# Patient Record
Sex: Female | Born: 1990 | Hispanic: No | Marital: Married | Smoking: Never smoker
Health system: Southern US, Community
[De-identification: ages and names within clinical notes are randomized; demographics above are authoritative.]

## PROBLEM LIST (undated history)

## (undated) DIAGNOSIS — O24419 Gestational diabetes mellitus in pregnancy, unspecified control: Secondary | ICD-10-CM

## (undated) DIAGNOSIS — K219 Gastro-esophageal reflux disease without esophagitis: Secondary | ICD-10-CM

## (undated) DIAGNOSIS — N979 Female infertility, unspecified: Secondary | ICD-10-CM

## (undated) DIAGNOSIS — E282 Polycystic ovarian syndrome: Secondary | ICD-10-CM

## (undated) HISTORY — PX: NO PAST SURGERIES: SHX2092

## (undated) HISTORY — DX: Polycystic ovarian syndrome: E28.2

---

## 2017-08-27 DIAGNOSIS — Z3149 Encounter for other procreative investigation and testing: Secondary | ICD-10-CM | POA: Diagnosis not present

## 2017-09-06 DIAGNOSIS — Z3149 Encounter for other procreative investigation and testing: Secondary | ICD-10-CM | POA: Diagnosis not present

## 2017-09-08 DIAGNOSIS — J069 Acute upper respiratory infection, unspecified: Secondary | ICD-10-CM | POA: Diagnosis not present

## 2017-10-07 DIAGNOSIS — Z3149 Encounter for other procreative investigation and testing: Secondary | ICD-10-CM | POA: Diagnosis not present

## 2017-10-15 DIAGNOSIS — Z0000 Encounter for general adult medical examination without abnormal findings: Secondary | ICD-10-CM | POA: Diagnosis not present

## 2017-10-25 DIAGNOSIS — O0900 Supervision of pregnancy with history of infertility, unspecified trimester: Secondary | ICD-10-CM | POA: Diagnosis not present

## 2017-10-25 DIAGNOSIS — Z3201 Encounter for pregnancy test, result positive: Secondary | ICD-10-CM | POA: Diagnosis not present

## 2017-10-25 DIAGNOSIS — Z3A01 Less than 8 weeks gestation of pregnancy: Secondary | ICD-10-CM | POA: Diagnosis not present

## 2017-11-21 DIAGNOSIS — Z3201 Encounter for pregnancy test, result positive: Secondary | ICD-10-CM | POA: Diagnosis not present

## 2017-12-10 DIAGNOSIS — Z118 Encounter for screening for other infectious and parasitic diseases: Secondary | ICD-10-CM | POA: Diagnosis not present

## 2017-12-10 DIAGNOSIS — O0900 Supervision of pregnancy with history of infertility, unspecified trimester: Secondary | ICD-10-CM | POA: Diagnosis not present

## 2017-12-10 LAB — OB RESULTS CONSOLE RUBELLA ANTIBODY, IGM: Rubella: IMMUNE

## 2017-12-10 LAB — OB RESULTS CONSOLE HEPATITIS B SURFACE ANTIGEN: Hepatitis B Surface Ag: NEGATIVE

## 2017-12-10 LAB — OB RESULTS CONSOLE ABO/RH: RH Type: POSITIVE

## 2017-12-10 LAB — OB RESULTS CONSOLE HIV ANTIBODY (ROUTINE TESTING): HIV: NONREACTIVE

## 2017-12-10 LAB — OB RESULTS CONSOLE GC/CHLAMYDIA
Chlamydia: NEGATIVE
Gonorrhea: NEGATIVE

## 2017-12-10 LAB — OB RESULTS CONSOLE RPR: RPR: NONREACTIVE

## 2018-01-09 DIAGNOSIS — Z3A16 16 weeks gestation of pregnancy: Secondary | ICD-10-CM | POA: Diagnosis not present

## 2018-01-09 DIAGNOSIS — O0900 Supervision of pregnancy with history of infertility, unspecified trimester: Secondary | ICD-10-CM | POA: Diagnosis not present

## 2018-01-27 ENCOUNTER — Encounter (HOSPITAL_COMMUNITY): Payer: Self-pay | Admitting: Obstetrics

## 2018-01-27 DIAGNOSIS — O0900 Supervision of pregnancy with history of infertility, unspecified trimester: Secondary | ICD-10-CM | POA: Diagnosis not present

## 2018-01-27 DIAGNOSIS — Z3A18 18 weeks gestation of pregnancy: Secondary | ICD-10-CM | POA: Diagnosis not present

## 2018-01-28 ENCOUNTER — Encounter (HOSPITAL_COMMUNITY): Payer: Self-pay

## 2018-01-29 NOTE — L&D Delivery Note (Signed)
Delivery Note At 2:02 AM a viable female was delivered via Vaginal, Spontaneous (Presentation: frank breech, right sacrum transverse;  ).  APGAR: 7, 9; weight  .  pending Placenta status: , spontaneous, intact.  Cord:  3VC with the following  complications: .none  Cord pH: n/a  Once pt began to feel pressure and sacrum at introitus pt began to push in lithotomy position. Foley catheter removed. On third set of pushes hips delivered and legs followed spontaneously. Baby then delivered to shoulders spontaneously. Body supported. Cord pulled down. Left arm delivered spontaneously and baby rotated for easy delivery of right arm. Towel wrapped around baby and and with gentle elevation, about 30 degrees, suprapubic pressure finger in mouth for flexion, head delivered easily.   Anesthesia:  epidural Episiotomy: None Lacerations: 2nd degree;Perineal Suture Repair: 3.0 vicryl rapide Est. Blood Loss (mL): 176  Mom to postpartum.  Baby to Couplet care / Skin to Skin   Plan to call NICU for eval if baby still alive at 2 hrs of age to discuss comfort care.  Lendon Colonel 05/22/2018, 2:21 AM

## 2018-02-03 ENCOUNTER — Other Ambulatory Visit (HOSPITAL_COMMUNITY): Payer: Self-pay | Admitting: *Deleted

## 2018-02-03 ENCOUNTER — Encounter (HOSPITAL_COMMUNITY): Payer: Self-pay | Admitting: *Deleted

## 2018-02-03 DIAGNOSIS — Q614 Renal dysplasia: Secondary | ICD-10-CM

## 2018-02-05 ENCOUNTER — Ambulatory Visit (HOSPITAL_COMMUNITY)
Admission: RE | Admit: 2018-02-05 | Discharge: 2018-02-05 | Disposition: A | Discharge status: Home / Self Care | Payer: 59 | Source: Ambulatory Visit | Attending: Obstetrics | Admitting: Obstetrics

## 2018-02-05 ENCOUNTER — Other Ambulatory Visit (HOSPITAL_COMMUNITY): Payer: Self-pay | Admitting: *Deleted

## 2018-02-05 ENCOUNTER — Encounter (HOSPITAL_COMMUNITY): Payer: Self-pay

## 2018-02-05 DIAGNOSIS — Z363 Encounter for antenatal screening for malformations: Secondary | ICD-10-CM | POA: Diagnosis not present

## 2018-02-05 DIAGNOSIS — O358XX0 Maternal care for other (suspected) fetal abnormality and damage, not applicable or unspecified: Secondary | ICD-10-CM | POA: Diagnosis not present

## 2018-02-05 DIAGNOSIS — Z3A20 20 weeks gestation of pregnancy: Secondary | ICD-10-CM | POA: Diagnosis not present

## 2018-02-05 DIAGNOSIS — Q614 Renal dysplasia: Secondary | ICD-10-CM

## 2018-02-05 HISTORY — DX: Female infertility, unspecified: N97.9

## 2018-02-05 HISTORY — DX: Gastro-esophageal reflux disease without esophagitis: K21.9

## 2018-03-12 ENCOUNTER — Other Ambulatory Visit (HOSPITAL_COMMUNITY): Payer: Self-pay | Admitting: *Deleted

## 2018-03-12 ENCOUNTER — Ambulatory Visit (HOSPITAL_COMMUNITY)
Admission: RE | Admit: 2018-03-12 | Discharge: 2018-03-12 | Disposition: A | Discharge status: Home / Self Care | Payer: 59 | Source: Ambulatory Visit | Attending: Obstetrics | Admitting: Obstetrics

## 2018-03-12 ENCOUNTER — Encounter (HOSPITAL_COMMUNITY): Payer: Self-pay

## 2018-03-12 DIAGNOSIS — Z3A25 25 weeks gestation of pregnancy: Secondary | ICD-10-CM | POA: Diagnosis not present

## 2018-03-12 DIAGNOSIS — O358XX0 Maternal care for other (suspected) fetal abnormality and damage, not applicable or unspecified: Secondary | ICD-10-CM

## 2018-03-12 DIAGNOSIS — Z362 Encounter for other antenatal screening follow-up: Secondary | ICD-10-CM

## 2018-03-12 DIAGNOSIS — Q614 Renal dysplasia: Secondary | ICD-10-CM | POA: Diagnosis present

## 2018-03-12 DIAGNOSIS — O4102X0 Oligohydramnios, second trimester, not applicable or unspecified: Secondary | ICD-10-CM | POA: Diagnosis not present

## 2018-03-19 ENCOUNTER — Encounter (HOSPITAL_COMMUNITY): Payer: Self-pay

## 2018-03-19 ENCOUNTER — Ambulatory Visit (HOSPITAL_COMMUNITY)
Admission: RE | Admit: 2018-03-19 | Discharge: 2018-03-19 | Disposition: A | Discharge status: Home / Self Care | Payer: 59 | Source: Ambulatory Visit | Attending: Obstetrics | Admitting: Obstetrics

## 2018-03-19 DIAGNOSIS — O358XX0 Maternal care for other (suspected) fetal abnormality and damage, not applicable or unspecified: Secondary | ICD-10-CM | POA: Diagnosis not present

## 2018-03-19 DIAGNOSIS — Z3A26 26 weeks gestation of pregnancy: Secondary | ICD-10-CM

## 2018-03-19 DIAGNOSIS — Q614 Renal dysplasia: Secondary | ICD-10-CM | POA: Insufficient documentation

## 2018-03-19 NOTE — ED Notes (Signed)
Natasha Mead with palliative notified of need for consult.

## 2018-03-19 NOTE — ED Notes (Signed)
Contacted Hoy Finlay, RN to arrange NICU consult.  She will try to arrange consult for next week.

## 2018-03-25 ENCOUNTER — Encounter (HOSPITAL_COMMUNITY): Payer: Self-pay

## 2018-03-25 NOTE — Progress Notes (Signed)
Neonatology consult scheduled for 03/28/2018 at 1:00 at the [facility]. Family to call to the unit and Hoy Finlay, Discharge Coordinator, will come to the security desk and escort the family to the 3rd floor, NICU, for neonatology consult.

## 2018-03-26 ENCOUNTER — Encounter: Payer: Self-pay | Admitting: Registered"

## 2018-03-26 ENCOUNTER — Encounter: Payer: 59 | Attending: Obstetrics | Admitting: Registered"

## 2018-03-26 DIAGNOSIS — O99810 Abnormal glucose complicating pregnancy: Secondary | ICD-10-CM | POA: Diagnosis present

## 2018-03-26 NOTE — Progress Notes (Signed)
Patient was seen on 03/26/18 for Gestational Diabetes self-management class at [facility]. The following learning objectives were met by the patient during this course:   States the definition of Gestational Diabetes  States why dietary management is important in controlling blood glucose  Describes the effects each nutrient has on blood glucose levels  Demonstrates ability to create a balanced meal plan  Demonstrates carbohydrate counting   States when to check blood glucose levels  Demonstrates proper blood glucose monitoring techniques  States the effect of stress and exercise on blood glucose levels  States the importance of limiting caffeine and abstaining from alcohol and smoking  Blood glucose monitor given: [employer] Lot # [id number] X Exp: 09/29/18 Blood glucose reading: 70 mg/dL  Patient instructed to monitor glucose levels: FBS: 60 - <95; 1 hour: <140; 2 hour: <120  Patient received handouts:  Nutrition Diabetes and Pregnancy, including carb counting list  Patient will be seen for follow-up as needed.

## 2018-03-28 ENCOUNTER — Encounter: Payer: Self-pay | Admitting: Pediatrics

## 2018-03-28 ENCOUNTER — Telehealth: Payer: Self-pay | Admitting: Pediatrics

## 2018-04-02 ENCOUNTER — Encounter (HOSPITAL_COMMUNITY): Payer: Self-pay | Admitting: *Deleted

## 2018-04-02 ENCOUNTER — Ambulatory Visit (HOSPITAL_COMMUNITY): Payer: 59 | Admitting: *Deleted

## 2018-04-02 ENCOUNTER — Ambulatory Visit (HOSPITAL_COMMUNITY)
Admission: RE | Admit: 2018-04-02 | Discharge: 2018-04-02 | Disposition: A | Discharge status: Home / Self Care | Payer: 59 | Source: Ambulatory Visit | Attending: Obstetrics | Admitting: Obstetrics

## 2018-04-02 VITALS — BP 97/61 | HR 84 | Wt 114.0 lb

## 2018-04-02 DIAGNOSIS — O0990 Supervision of high risk pregnancy, unspecified, unspecified trimester: Secondary | ICD-10-CM

## 2018-04-02 DIAGNOSIS — Z3A28 28 weeks gestation of pregnancy: Secondary | ICD-10-CM

## 2018-04-02 DIAGNOSIS — Q614 Renal dysplasia: Secondary | ICD-10-CM | POA: Diagnosis not present

## 2018-04-02 DIAGNOSIS — O358XX0 Maternal care for other (suspected) fetal abnormality and damage, not applicable or unspecified: Secondary | ICD-10-CM | POA: Diagnosis not present

## 2018-04-03 ENCOUNTER — Other Ambulatory Visit (HOSPITAL_COMMUNITY): Payer: Self-pay | Admitting: *Deleted

## 2018-04-03 DIAGNOSIS — O4103X0 Oligohydramnios, third trimester, not applicable or unspecified: Secondary | ICD-10-CM

## 2018-04-07 NOTE — Social Work (Signed)
LCSW met with Cheryl Barron and Cheryl Barron at [facility] appt.  LCSW joined Dr. Donalee Citrin in the counseling meeting and observed the ultrasound while he counseled them.  Afterwards, SW spoke with mom and dad alone and offered Perinatal Counseling support, which they accepted.  They are planning to come to the [facility] on Monday at 9 am to meet with Cheryl Barron, SW and Cheryl Robinsons, RN.    ** Update**  Met with mom and dad on March 9th at 56 am.  RN assessed their understanding of the diagnosis/prognosis, discussed choices they have at labor/delivery and for baby depending on whether he survives labor or not.  SW discussed the [employer], as well as provided emotional support to mom and dad.  Today was focused on education around the birth plan decisions.  They request to call SW to schedule the next appt and take a copy of the birth plan to look over decisions.  They are very concerned about baby's comfort.  They are willing to consider more aggressive measures if baby's prognosis is better than anticipated.  SW will plan to meet with them again to complete the birth plan.

## 2018-05-01 ENCOUNTER — Ambulatory Visit (HOSPITAL_COMMUNITY): Payer: 59

## 2018-05-05 ENCOUNTER — Encounter (HOSPITAL_COMMUNITY): Payer: Self-pay

## 2018-05-05 ENCOUNTER — Ambulatory Visit (HOSPITAL_COMMUNITY)
Admission: RE | Admit: 2018-05-05 | Discharge: 2018-05-05 | Disposition: A | Discharge status: Home / Self Care | Payer: 59 | Source: Ambulatory Visit | Attending: Obstetrics and Gynecology | Admitting: Obstetrics and Gynecology

## 2018-05-05 ENCOUNTER — Ambulatory Visit (HOSPITAL_COMMUNITY): Payer: 59 | Admitting: *Deleted

## 2018-05-05 ENCOUNTER — Other Ambulatory Visit (HOSPITAL_COMMUNITY): Payer: Self-pay | Admitting: Obstetrics and Gynecology

## 2018-05-05 ENCOUNTER — Other Ambulatory Visit: Payer: Self-pay

## 2018-05-05 VITALS — Temp 98.4°F

## 2018-05-05 DIAGNOSIS — O4103X0 Oligohydramnios, third trimester, not applicable or unspecified: Secondary | ICD-10-CM

## 2018-05-05 DIAGNOSIS — O358XX0 Maternal care for other (suspected) fetal abnormality and damage, not applicable or unspecified: Secondary | ICD-10-CM | POA: Diagnosis not present

## 2018-05-05 DIAGNOSIS — O359XX0 Maternal care for (suspected) fetal abnormality and damage, unspecified, not applicable or unspecified: Secondary | ICD-10-CM

## 2018-05-05 DIAGNOSIS — O365930 Maternal care for other known or suspected poor fetal growth, third trimester, not applicable or unspecified: Secondary | ICD-10-CM | POA: Diagnosis not present

## 2018-05-05 DIAGNOSIS — Z3A32 32 weeks gestation of pregnancy: Secondary | ICD-10-CM

## 2018-05-21 ENCOUNTER — Inpatient Hospital Stay (HOSPITAL_COMMUNITY): Payer: 59 | Admitting: Anesthesiology

## 2018-05-21 ENCOUNTER — Encounter (HOSPITAL_COMMUNITY): Payer: Self-pay | Admitting: *Deleted

## 2018-05-21 ENCOUNTER — Other Ambulatory Visit: Payer: Self-pay

## 2018-05-21 ENCOUNTER — Inpatient Hospital Stay (HOSPITAL_COMMUNITY)
Admission: AD | Admit: 2018-05-21 | Discharge: 2018-05-23 | DRG: 807 | Disposition: A | Discharge status: Home / Self Care | Payer: 59 | Attending: Obstetrics | Admitting: Obstetrics

## 2018-05-21 DIAGNOSIS — O35EXX0 Maternal care for other (suspected) fetal abnormality and damage, fetal genitourinary anomalies, not applicable or unspecified: Secondary | ICD-10-CM

## 2018-05-21 DIAGNOSIS — O358XX0 Maternal care for other (suspected) fetal abnormality and damage, not applicable or unspecified: Secondary | ICD-10-CM | POA: Diagnosis present

## 2018-05-21 DIAGNOSIS — O24420 Gestational diabetes mellitus in childbirth, diet controlled: Secondary | ICD-10-CM | POA: Diagnosis present

## 2018-05-21 DIAGNOSIS — O359XX0 Maternal care for (suspected) fetal abnormality and damage, unspecified, not applicable or unspecified: Secondary | ICD-10-CM | POA: Diagnosis present

## 2018-05-21 DIAGNOSIS — O4103X0 Oligohydramnios, third trimester, not applicable or unspecified: Secondary | ICD-10-CM | POA: Diagnosis present

## 2018-05-21 DIAGNOSIS — O321XX0 Maternal care for breech presentation, not applicable or unspecified: Secondary | ICD-10-CM | POA: Diagnosis present

## 2018-05-21 DIAGNOSIS — Z3A35 35 weeks gestation of pregnancy: Secondary | ICD-10-CM | POA: Diagnosis not present

## 2018-05-21 HISTORY — DX: Gestational diabetes mellitus in pregnancy, unspecified control: O24.419

## 2018-05-21 LAB — CBC
HCT: 41.4 % (ref 36.0–46.0)
Hemoglobin: 13.5 g/dL (ref 12.0–15.0)
MCH: 29.9 pg (ref 26.0–34.0)
MCHC: 32.6 g/dL (ref 30.0–36.0)
MCV: 91.6 fL (ref 80.0–100.0)
Platelets: 252 10*3/uL (ref 150–400)
RBC: 4.52 MIL/uL (ref 3.87–5.11)
RDW: 13.6 % (ref 11.5–15.5)
WBC: 11.5 10*3/uL — ABNORMAL HIGH (ref 4.0–10.5)
nRBC: 0 % (ref 0.0–0.2)

## 2018-05-21 LAB — TYPE AND SCREEN
ABO/RH(D): A POS
Antibody Screen: NEGATIVE

## 2018-05-21 LAB — GROUP B STREP BY PCR: Group B strep by PCR: NEGATIVE

## 2018-05-21 MED ORDER — LACTATED RINGERS IV SOLN
500.0000 mL | Freq: Once | INTRAVENOUS | Status: AC
  Administered 2018-05-21: 500 mL via INTRAVENOUS

## 2018-05-21 MED ORDER — PHENYLEPHRINE 40 MCG/ML (10ML) SYRINGE FOR IV PUSH (FOR BLOOD PRESSURE SUPPORT): 80.0000 ug | PREFILLED_SYRINGE | INTRAVENOUS | Status: DC | PRN

## 2018-05-21 MED ORDER — EPHEDRINE 5 MG/ML INJ: 10.0000 mg | INTRAVENOUS | Status: DC | PRN

## 2018-05-21 MED ORDER — TERBUTALINE SULFATE 1 MG/ML IJ SOLN: 0.2500 mg | Freq: Once | INTRAMUSCULAR | Status: DC | PRN

## 2018-05-21 MED ORDER — LIDOCAINE HCL (PF) 1 % IJ SOLN: 30.0000 mL | INTRAMUSCULAR | Status: DC | PRN

## 2018-05-21 MED ORDER — LACTATED RINGERS IV SOLN
INTRAVENOUS | Status: DC
  Administered 2018-05-21 (×2): via INTRAVENOUS

## 2018-05-21 MED ORDER — OXYTOCIN 40 UNITS IN NORMAL SALINE INFUSION - SIMPLE MED
2.5000 [IU]/h | INTRAVENOUS | Status: DC
  Filled 2018-05-21: qty 1000

## 2018-05-21 MED ORDER — OXYTOCIN BOLUS FROM INFUSION
500.0000 mL | Freq: Once | INTRAVENOUS | Status: AC
  Administered 2018-05-22: 500 mL via INTRAVENOUS

## 2018-05-21 MED ORDER — OXYCODONE-ACETAMINOPHEN 5-325 MG PO TABS: 1.0000 | ORAL_TABLET | ORAL | Status: DC | PRN

## 2018-05-21 MED ORDER — PHENYLEPHRINE 40 MCG/ML (10ML) SYRINGE FOR IV PUSH (FOR BLOOD PRESSURE SUPPORT)
80.0000 ug | PREFILLED_SYRINGE | INTRAVENOUS | Status: DC | PRN
  Filled 2018-05-21: qty 10

## 2018-05-21 MED ORDER — SOD CITRATE-CITRIC ACID 500-334 MG/5ML PO SOLN: 30.0000 mL | ORAL | Status: DC | PRN

## 2018-05-21 MED ORDER — OXYCODONE-ACETAMINOPHEN 5-325 MG PO TABS: 2.0000 | ORAL_TABLET | ORAL | Status: DC | PRN

## 2018-05-21 MED ORDER — ONDANSETRON HCL 4 MG/2ML IJ SOLN: 4.0000 mg | Freq: Four times a day (QID) | INTRAMUSCULAR | Status: DC | PRN

## 2018-05-21 MED ORDER — DIPHENHYDRAMINE HCL 50 MG/ML IJ SOLN: 12.5000 mg | INTRAMUSCULAR | Status: DC | PRN

## 2018-05-21 MED ORDER — FLEET ENEMA 7-19 GM/118ML RE ENEM: 1.0000 | ENEMA | RECTAL | Status: DC | PRN

## 2018-05-21 MED ORDER — OXYTOCIN 40 UNITS IN NORMAL SALINE INFUSION - SIMPLE MED
1.0000 m[IU]/min | INTRAVENOUS | Status: DC
  Administered 2018-05-21: 19:00:00 2 m[IU]/min via INTRAVENOUS

## 2018-05-21 MED ORDER — SODIUM CHLORIDE (PF) 0.9 % IJ SOLN
INTRAMUSCULAR | Status: DC | PRN
  Administered 2018-05-21: 12 mL/h via EPIDURAL

## 2018-05-21 MED ORDER — FENTANYL-BUPIVACAINE-NACL 0.5-0.125-0.9 MG/250ML-% EP SOLN
12.0000 mL/h | EPIDURAL | Status: DC | PRN
  Filled 2018-05-21: qty 250

## 2018-05-21 MED ORDER — LACTATED RINGERS IV SOLN: 500.0000 mL | INTRAVENOUS | Status: DC | PRN

## 2018-05-21 MED ORDER — LIDOCAINE HCL (PF) 1 % IJ SOLN
INTRAMUSCULAR | Status: DC | PRN
  Administered 2018-05-21: 11 mL via EPIDURAL

## 2018-05-21 MED ORDER — ACETAMINOPHEN 325 MG PO TABS: 650.0000 mg | ORAL_TABLET | ORAL | Status: DC | PRN

## 2018-05-21 NOTE — MAU Note (Signed)
PT SAYS SHE WOKE AT 0430 WITH VAG BLEEDING.  IT ALSO HAPPENED YESTERDAY- WENT  TO DR-- SAID IT'S IRRITATION- VE - CLOSED.   STARTED UC'S YESTERDAY.    LAST SEX-  NONE IN April.    PAD ON IN TRIAGE - SMALL AMT LIGHT RED.

## 2018-05-21 NOTE — Progress Notes (Signed)
Met with patient and patient's husband at the bedside, which included this RN, Jeanella Anton (chaplain) and Dr. Pamala Hurry. Dr. Pamala Hurry went over patient's wishes at this point regarding the patient's care and baby care post delivery. Patient given choices for c-section versus laboring and delivering breech vaginally. Patient would prefer to deliver vaginally and have comfort care for the baby so that she can spend the first few hours with the baby. Patient is comfortable with her epidural and is resting comfortably. Nursing will continue to assess.

## 2018-05-21 NOTE — H&P (Signed)
Cheryl Barron is a 28 y.o. G1P0 at [redacted]w[redacted]d presenting for active labor. Pt notes onset contractions yesterday.  Patient seen in the office for contractions and vaginal bleeding yesterday.  No active bleeding was noted and no cervical change.  Patient notes she continued to contract through the night and presented to MAU this morning.  On initial exam patient was with a closed cervix that then changed to 2.5 and then to 3.5 cm.  During this time patient increase in contraction frequency and contraction pain.  Patient notes good fetal movement, continues with light vaginal bleeding though a few episodes of heavier red bleeding this morning.  Patient does not report leaking fluid though this is not expected given her anhydramnios.  Patient's pregnancy is significantly complicated by fetal anhydramnios since 26 weeks and bilateral nonfunctioning dysplastic kidneys.  Patient has been seen by maternal-fetal medicine and has had multiple consultants.  She is also seen neonatology in consultation.  Fetal prognosis has been poor since this diagnosis at 25 weeks.  Given high likelihood of rapid neonatal demise no obstetric intervention has been planned.  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  Dated by early first trimester ultrasound -Gestational diabetes.  Excellent control with diet alone though patient has not been routinely checking her blood sugars over the last 2 weeks -Breech presentation.  Patient understands difficulties of vaginal breech delivery but given high likelihood of neonatal demise patient has opted against a cesarean section.  Again reviewed with patient risks of head entrapment and prolonged delivery of breech.  Patient is aware that attempts to deliver this breech baby vaginally may lead to intrapartum demise. 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  On  25-week ultrasound with MFM bilateral dysplastic kidneys were noted with severe oligohydramnios.  By 26-week anhydramnios was noted.  At this point poor prognosis discussed by multiple members of care team with patient and husband and further monitoring during pregnancy and labor and delivery plan was made.  Patient has been made aware of high likelihood of abnormal lung development due to anhydramnios.  Patient and partner aware that baby may be unable to breathe at delivery and that neonatal resuscitation from a respiratory standpoint is unlikely to be successful.  They are also aware that we cannot completely predict lung development given that baby did have some fluid up until 25 weeks.  In in this case the baby may have labored or possibly normal breathing initially.  However respiratory support would not improve the dysplastic kidneys and the baby would then expire from renal failure, usually over several days to 1 week.  Parents do have the option of comfort care via NICU team.  Plan has been made to call NICU for further evaluation and consideration of comfort care if baby lives past the 2-hour mark.   Prenatal Transfer Tool  Maternal Diabetes: Yes:  Diabetes Type:  Diet controlled Genetic Screening: Normal Maternal Ultrasounds/Referrals: Abnormal:  Findings:   Fetal Kidney Anomalies, Other: Fetal Ultrasounds or other Referrals:  Referred to Materal Fetal Medicine  Maternal Substance Abuse:  No Significant Maternal Medications:  None Significant Maternal Lab Results: None     OB History    Gravida  1   Para      Term      Preterm      AB      Living  SAB      TAB      Ectopic      Multiple      Live Births             Past Medical History:  Diagnosis Date  . Female infertility   . GERD (gastroesophageal reflux disease)   . Gestational diabetes    Past Surgical History:  Procedure Laterality Date  . NO PAST SURGERIES     Family History: family history  is not on file. Social History:  reports that she has never smoked. She has never used smokeless tobacco. She reports that she does not drink alcohol or use drugs.  Review of Systems - Negative except Regular contractions and vaginal bleeding   Dilation: 4.5 Effacement (%): 50 Station: -2 Exam by:: Mary SwazilandJordan Johnson, RN  Blood pressure 126/87, pulse 87, temperature 98.1 F (36.7 C), temperature source Oral, resp. rate 16, height 5' (1.524 m), weight 52.2 kg, SpO2 100 %.  Physical Exam:  Gen: well appearing, no distress  Abd: gravid, NT, no RUQ pain LE: No edema, equal bilaterally, non-tender Toco: Not monitoring as long as patient makes change FH: Present on admission.  Fetal monitoring not planned  Prenatal labs: ABO, Rh: --/--/A POS, A POS Performed at [facility], [address] [place], [name] [address]  [phone](04/22 1411) Antibody: NEG (04/22 1411) Rubella:  Immune RPR:   Nonreactive HBsAg:   Negative HIV:   Negative GBS:   Rapid negative 1 hr Glucola abnormal, GDM diagnosed  Genetic screening normal NT, normal AFP Anatomy US oligo than anhydramnios, dysplastic bilateral kidneys, growth restriction   Assessment/Plan: 28 y.o. G1P0 at 1921w1d Active labor.  Continue expectant management.  Will plan Pitocin if cervical change slows.  At that point would plan tocometry only -GDM.  Patient has had well-controlled diabetes and we do not need to monitor in labor as neonatal demise expected -Breech presentation.  Again reviewed with patient and husband that vaginal breech deliveries may take longer and have risk of head entrapment but that we do not expect patient to be in pain during this time.  Cesarean section would be indicated for breech presentation if fetal survival was expected but given fetal anomalies we recommend against cesarean section given maternal risk that would not improve fetal survival.  Patient is aware that breech delivery could impact risk of intrapartum  demise.  Patient has been agreed to vaginal breech attempt. -Bilateral dysplastic kidneys and expected neonatal demise.  We will plan NICU consult for comfort care measures if baby survives past 2 hours.  Plan again extensively reviewed today and patient and husband are in agreement with plan as above.  We are not planning cesarean section or any fetal monitoring.  We are not planning fetal intubation or aggressive resuscitation.  We would consider comfort care only for fetus, particularly skin to skin contact after delivery.  Patient and husband has again been offered primary cesarean section with aggressive resuscitation by the NICU team though given all MFM expert consultation through the pregnancy we do not expect this to lead to long-term fetal survival.  Patient has been agreed to proceed with original plan.  Labor nurse and chaplain present for conversation.   Lendon ColonelKelly A Lataya Varnell 05/21/2018, 6:20 PM

## 2018-05-21 NOTE — Anesthesia Preprocedure Evaluation (Signed)
Anesthesia Evaluation  Patient identified by MRN, date of birth, ID band Patient awake    Reviewed: Allergy & Precautions, NPO status , Patient's Chart, lab work & pertinent test results  Airway Mallampati: II  TM Distance: >3 FB Neck ROM: Full    Dental no notable dental hx.    Pulmonary neg pulmonary ROS,    Pulmonary exam normal breath sounds clear to auscultation       Cardiovascular negative cardio ROS Normal cardiovascular exam Rhythm:Regular Rate:Normal     Neuro/Psych negative neurological ROS  negative psych ROS   GI/Hepatic negative GI ROS, Neg liver ROS,   Endo/Other  negative endocrine ROSdiabetes  Renal/GU negative Renal ROS  negative genitourinary   Musculoskeletal negative musculoskeletal ROS (+)   Abdominal   Peds negative pediatric ROS (+)  Hematology negative hematology ROS (+)   Anesthesia Other Findings   Reproductive/Obstetrics (+) Pregnancy                             Anesthesia Physical Anesthesia Plan  ASA: II  Anesthesia Plan: Epidural   Post-op Pain Management:    Induction:   PONV Risk Score and Plan:   Airway Management Planned:   Additional Equipment:   Intra-op Plan:   Post-operative Plan:   Informed Consent:   Plan Discussed with:   Anesthesia Plan Comments:         Anesthesia Quick Evaluation  

## 2018-05-21 NOTE — Progress Notes (Signed)
Verified with Judeth Horn, NP that we are doing intermittent dopplers, no EFM.  Also okay with NP for patient to be able to drink PO fluids.  Updated set of VS taken and pain discussed with provider.  Waiting for Dr. Algie Coffer to call back to discuss POC.

## 2018-05-21 NOTE — Progress Notes (Signed)
I introduced spiritual care services to pt and her husband.  They are very much trying to stay in the present moment right now and not get too far ahead in thinking about after delivery.  FOB stated that he is aware of his baby's conditions, but that he is still hoping for a miracle.  The family then asked for prayer; they are relying on their Christian faith to help them through this.  After speaking with pt's nurse, we plan to speak again with the family once Dr. Algie Coffer arrives so that we can be sure everyone is on the same page with the goals of care once baby is born.  Chaplain Dyanne Carrel, Bcc Pager, [phone] 4:22 PM    05/21/18 1600  Clinical Encounter Type  Visited With Patient and family together  Visit Type Spiritual support  Referral From Nurse  Spiritual Encounters  Spiritual Needs Prayer;Emotional

## 2018-05-21 NOTE — MAU Provider Note (Addendum)
Chief Complaint:  Vaginal Bleeding   First Provider Initiated Contact with Patient 05/21/18 [phone]      HPI: Cheryl Barron is a 29 y.o. G1P0 at 49w1dwho presents to maternity admissions reporting uterine contractions and vaginal bleeding with passage of one clot.  Had bleeding yesterday also and went to office for exam.  States cervix was closed.. She denies LOF, vaginal itching/burning, urinary symptoms, h/a, dizziness, n/v, diarrhea, constipation or fever/chills.    Has a fetus with known renal anomalies and oligo/anhydramnios.  Plans are for labor without fetal heart rate monitoring.     Past Medical History: Past Medical History:  Diagnosis Date  . Female infertility   . GERD (gastroesophageal reflux disease)   . Gestational diabetes     Past obstetric history: OB History  Gravida Para Term Preterm AB Living  1            SAB TAB Ectopic Multiple Live Births               # Outcome Date GA Lbr Len/2nd Weight Sex Delivery Anes PTL Lv  1 Current             Past Surgical History: Past Surgical History:  Procedure Laterality Date  . NO PAST SURGERIES      Family History: History reviewed. No pertinent family history.  Social History: Social History   Tobacco Use  . Smoking status: Never Smoker  . Smokeless tobacco: Never Used  Substance Use Topics  . Alcohol use: Never    Frequency: Never  . Drug use: Never    Allergies: No Known Allergies  Meds:  Medications Prior to Admission  Medication Sig Dispense Refill Last Dose  . Prenatal Vit-Fe Fumarate-FA (PRENATAL VITAMIN PO) Take by mouth.   05/20/2018 at Unknown time    I have reviewed patient's Past Medical Hx, Surgical Hx, Family Hx, Social Hx, medications and allergies.   ROS:  Review of Systems  Constitutional: Negative for chills and fever.  Gastrointestinal: Positive for abdominal pain. Negative for constipation, diarrhea and nausea.  Genitourinary: Positive for pelvic pain and vaginal bleeding.  Negative for vaginal discharge.   Other systems negative  Physical Exam   Patient Vitals for the past 24 hrs:  BP Temp Temp src Pulse Resp Height Weight  05/21/18 0651 104/76 98.5 F (36.9 C) Oral 85 18 5' (1.524 m) 52.2 kg   Constitutional: Well-developed, well-nourished female in no acute distress.  Cardiovascular: normal rate and rhythm Respiratory: normal effort, clear to auscultation bilaterally GI: Abd soft, non-tender, gravid appropriate for gestational age.   No rebound or guarding. MS: Extremities nontender, no edema, normal ROM Neurologic: Alert and oriented x 4.  GU: Neg CVAT.  PELVIC EXAM: dark red bloody show.  Only one tiny spot on pad Dilation: Fingertip Effacement (%): 50 Station: -3 Exam by:: Artelia Laroche CNM    FHT:  Fetal heart rate by doppler was 160 Contractions: q 6 mins    Labs: Results for orders placed or performed during the Barron encounter of 05/21/18 (from the past 24 hour(s))  CBC     Status: Abnormal   Collection Time: 05/21/18  2:11 PM  Result Value Ref Range   WBC 11.5 (H) 4.0 - 10.5 K/uL   RBC 4.52 3.87 - 5.11 MIL/uL   Hemoglobin 13.5 12.0 - 15.0 g/dL   HCT 19.3 79.0 - 24.0 %   MCV 91.6 80.0 - 100.0 fL   MCH 29.9 26.0 - 34.0 pg   MCHC  32.6 30.0 - 36.0 g/dL   RDW 16.113.6 09.611.5 - 04.515.5 %   Platelets 252 150 - 400 K/uL   nRBC 0.0 0.0 - 0.2 %  Type and screen Cheryl Barron     Status: None (Preliminary result)   Collection Time: 05/21/18  2:11 PM  Result Value Ref Range   ABO/RH(D) PENDING    Antibody Screen PENDING    Sample Expiration      05/24/2018 Performed at [facility], [address] [place], [name] [address]        Imaging:    MAU Course/MDM: Discussed the bloody show with patient.  Discussed it may be from early dilation or possibly also from being checked yesterday.  Vaginal exams are very difficult for her.   Discussed this is likely very early labor. She is concerned it is so painful it may  be real labor.   Assessment: Single intrauterine pregnancy at 7331w1d Baby with anomalies Latent vs prodromal labor Bleeding, likely show  Plan: Observe for now Report to oncoming provider   Wynelle BourgeoisMarie Williams CNM, MSN Certified Nurse-Midwife 05/21/2018 7:36 AM  Pt informed that the ultrasound is considered a limited OB ultrasound and is not intended to be a complete ultrasound exam.  Patient also informed that the ultrasound is not being completed with the intent of assessing for fetal or placental anomalies or any pelvic abnormalities.  Explained that the purpose of today's ultrasound is to assess for  presentation.  Patient acknowledges the purpose of the exam and the limitations of the study. Breech  RH positive per prenatal record Cervical exam changed 2 hrs after initial assessment. New examiner & pt difficult exam d/t patient discomfort; ?whether true change in cervix, will continue to monitor & reassess cervix.  Patient monitored in MAU for ~6 hours. Continues to have contractions that have become increasingly painful & has made cervical change. Provider has been in contact with Dr. Ernestina PennaFogleman - will admit patient for early labor.   A: 1. Indication for care in labor and delivery, antepartum   2. [redacted] weeks gestation of pregnancy   3. Anhydramnios in third trimester, single or unspecified fetus   4. Fetal renal anomaly, single gestation    P: Admit to birthing suites GBS by pcr per Dr. Ernestina PennaFogleman No fetal monitoring per MFM  Judeth HornLawrence, Quintasha Gren, NP

## 2018-05-21 NOTE — Progress Notes (Signed)
I was present with pt and her husband as Dr. Algie Coffer reviewed the plan of care for baby.  Both pt and FOB wish to carry on with a plan of no c-section and no intubation or NICU presence. They asked good questions of Dr Algie Coffer and they are aware that because baby is not being monitored that there is a chance that baby will not be born alive and they are aware that baby is not expected to live long. After baby is born, they wish to have a prayer either from their own pastor (virtually) or from our chaplain. I will alert Chaplain Darcus Pester so that she is aware of the situation.  [address] Dyanne Carrel, Bcc Pager, [phone] 8:06 PM    05/21/18 1900  Clinical Encounter Type  Visited With Patient;Family;Health care provider  Visit Type Spiritual support

## 2018-05-21 NOTE — Progress Notes (Signed)
S: Doing well, no complaints, pain comfortable with  epidural  O: BP 111/81   Pulse 90   Temp 98.1 F (36.7 C) (Oral)   Resp 16   Ht 5' (1.524 m)   Wt 52.2 kg   SpO2 100%   BMI 22.48 kg/m    FHT:  Not monitoring UC:   Not monitoring SVE:   Dilation: 4.5 Effacement (%): 50 Station: -2 Exam by:: Dr. Ernestina Penna Fetal movement felt breech  A / P:  27 y.o.  OB History  Gravida Para Term Preterm AB Living  1 0 0 0 0 0  SAB TAB Ectopic Multiple Live Births  0 0 0 0 0   at [redacted]w[redacted]d will augment with pitocin due to no cervical change. R/B d/w pt add toco to monitor pitocin  Fetal Wellbeing:  expect neonatal demise, not monitorin Pain Control:  Epidural  Anticipated MOD:  attempt at vaginal breech  Lendon Colonel 05/21/2018, 6:51 PM

## 2018-05-21 NOTE — Anesthesia Procedure Notes (Signed)
Epidural Patient location during procedure: OB Start time: 05/21/2018 4:10 PM End time: 05/21/2018 4:26 PM  Staffing Anesthesiologist: Lowella Curb, MD Performed: anesthesiologist   Preanesthetic Checklist Completed: patient identified, site marked, surgical consent, pre-op evaluation, timeout performed, IV checked, risks and benefits discussed and monitors and equipment checked  Epidural Patient position: sitting Prep: ChloraPrep Patient monitoring: heart rate, cardiac monitor, continuous pulse ox and blood pressure Approach: midline Location: L2-L3 Injection technique: LOR saline  Needle:  Needle type: Tuohy  Needle gauge: 17 G Needle length: 9 cm Needle insertion depth: 4 cm Catheter type: closed end flexible Catheter size: 20 Guage Catheter at skin depth: 8 cm Test dose: negative  Assessment Events: blood not aspirated, injection not painful, no injection resistance, negative IV test and no paresthesia  Additional Notes Reason for block:procedure for pain

## 2018-05-22 ENCOUNTER — Encounter (HOSPITAL_COMMUNITY): Payer: Self-pay | Admitting: Certified Registered"

## 2018-05-22 ENCOUNTER — Encounter (HOSPITAL_COMMUNITY): Payer: Self-pay

## 2018-05-22 DIAGNOSIS — Q602 Renal agenesis, unspecified: Secondary | ICD-10-CM

## 2018-05-22 DIAGNOSIS — O359XX0 Maternal care for (suspected) fetal abnormality and damage, unspecified, not applicable or unspecified: Secondary | ICD-10-CM | POA: Diagnosis present

## 2018-05-22 LAB — ABO/RH: ABO/RH(D): A POS

## 2018-05-22 LAB — SYPHILIS: RPR W/REFLEX TO RPR TITER AND TREPONEMAL ANTIBODIES, TRADITIONAL SCREENING AND DIAGNOSIS ALGORITHM: RPR Ser Ql: NONREACTIVE

## 2018-05-22 MED ORDER — ONDANSETRON HCL 4 MG PO TABS: 4.0000 mg | ORAL_TABLET | ORAL | Status: DC | PRN

## 2018-05-22 MED ORDER — SIMETHICONE 80 MG PO CHEW: 80.0000 mg | CHEWABLE_TABLET | ORAL | Status: DC | PRN

## 2018-05-22 MED ORDER — IBUPROFEN 600 MG PO TABS
600.0000 mg | ORAL_TABLET | Freq: Four times a day (QID) | ORAL | Status: DC
  Administered 2018-05-22 – 2018-05-23 (×6): 600 mg via ORAL
  Filled 2018-05-22 (×6): qty 1

## 2018-05-22 MED ORDER — COCONUT OIL OIL: 1.0000 "application " | TOPICAL_OIL | Status: DC | PRN

## 2018-05-22 MED ORDER — OXYCODONE HCL 5 MG PO TABS: 10.0000 mg | ORAL_TABLET | ORAL | Status: DC | PRN

## 2018-05-22 MED ORDER — LIDOCAINE HCL (PF) 1 % IJ SOLN
INTRAMUSCULAR | Status: AC
  Filled 2018-05-22: qty 30

## 2018-05-22 MED ORDER — ONDANSETRON HCL 4 MG/2ML IJ SOLN: 4.0000 mg | INTRAMUSCULAR | Status: DC | PRN

## 2018-05-22 MED ORDER — OXYCODONE HCL 5 MG PO TABS: 5.0000 mg | ORAL_TABLET | ORAL | Status: DC | PRN

## 2018-05-22 MED ORDER — ZOLPIDEM TARTRATE 5 MG PO TABS: 5.0000 mg | ORAL_TABLET | Freq: Every evening | ORAL | Status: DC | PRN

## 2018-05-22 MED ORDER — WITCH HAZEL-GLYCERIN EX PADS: 1.0000 "application " | MEDICATED_PAD | CUTANEOUS | Status: DC | PRN

## 2018-05-22 MED ORDER — SENNOSIDES-DOCUSATE SODIUM 8.6-50 MG PO TABS
2.0000 | ORAL_TABLET | ORAL | Status: DC
  Administered 2018-05-22: 2 via ORAL
  Filled 2018-05-22: qty 2

## 2018-05-22 MED ORDER — BENZOCAINE-MENTHOL 20-0.5 % EX AERO
1.0000 "application " | INHALATION_SPRAY | CUTANEOUS | Status: DC | PRN
  Administered 2018-05-22: 1 via TOPICAL
  Filled 2018-05-22: qty 56

## 2018-05-22 MED ORDER — TETANUS-DIPHTH-ACELL PERTUSSIS 5-2.5-18.5 LF-MCG/0.5 IM SUSP: 0.5000 mL | Freq: Once | INTRAMUSCULAR | Status: DC

## 2018-05-22 MED ORDER — ACETAMINOPHEN 325 MG PO TABS
650.0000 mg | ORAL_TABLET | ORAL | Status: DC | PRN
  Administered 2018-05-22: 20:00:00 650 mg via ORAL
  Filled 2018-05-22: qty 2

## 2018-05-22 MED ORDER — PRENATAL MULTIVITAMIN CH
1.0000 | ORAL_TABLET | Freq: Every day | ORAL | Status: DC
  Administered 2018-05-22 – 2018-05-23 (×2): 1 via ORAL
  Filled 2018-05-22 (×2): qty 1

## 2018-05-22 MED ORDER — DIBUCAINE (PERIANAL) 1 % EX OINT: 1.0000 "application " | TOPICAL_OINTMENT | CUTANEOUS | Status: DC | PRN

## 2018-05-22 MED ORDER — DIPHENHYDRAMINE HCL 25 MG PO CAPS: 25.0000 mg | ORAL_CAPSULE | Freq: Four times a day (QID) | ORAL | Status: DC | PRN

## 2018-05-22 NOTE — Lactation Note (Signed)
This note was copied from a baby's chart. Lactation Consultation Note  Patient Name: Boy Reis Pienta WGNFA'O Date: 05/22/2018   I understodd from RN that Mom was interested in being set up with a pump, but wanted to see a Lactation Consultant beforehand.   I met with Mom at 7 hrs postpartum. She reported + breast changes w/pregnancy. I explained to Mom that using hand expression is likely to yield more colostrum than using a pump right now. So that Mom could make an informed decision, I also explained that pumping and/or hand expressing would facilitate bringing her milk to volume, which considering infant's prognosis, she may find herself with extra milk after the infant has passed.   Mom consented to learning hand expression. She was able to do it in an adequate way. Small drops of colostrum were expressed. Mom is not easy to read, so I again clarified her desire about expressing her milk. I explained that I would be happy to set her with up a DEBP, if desired. At this time, Mom wants to only do hand expression. Mom knows to put the colostrum vials in the refrigerator when she's done  An initial attempt was made to visit this Mom earlier, but infant's blood sugar was low at that time.   Matthias Hughs [facility] 05/22/2018, 8:43 AM

## 2018-05-22 NOTE — Progress Notes (Signed)
Pt complete since 10:30.  Starting to feel urge to push  Vitals:   05/21/18 2231 05/21/18 2301 05/21/18 2331 05/22/18 0001  BP: 114/68 108/83 125/75 107/63  Pulse: (!) 160 97 93 87  Resp:      Temp:      TempSrc:      SpO2:      Weight:      Height:       Toco: pit at 4, ctx q 3 min FH: not following GU: sacrum at +3, level of introitus  A/P: 35'2, G1, spont labor with SROM and augmentation. 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 Breech presentation, aware of risks of breech delivery. No planned neonatal resusitation. Good progress in labor, will cont to labor down, expect to start pushing in next hour but prefer baby to be lower. Pt and husband agree to plan. - epidural - champlain here.  Lendon Colonel 05/22/2018 1:04 AM

## 2018-05-22 NOTE — Progress Notes (Signed)
Phoned lactation to request a visit.

## 2018-05-22 NOTE — Progress Notes (Signed)
I checked in on family today after delivery.  They were doing well and were grateful to have this time with their son. They did not end up doing a baptism last night when baby was born because baby was doing better than expected. They are going to see how things develop today.  If it looks as though they may be taking baby home, they may wish to wait and do baptism with their own pastor. If things begin to decline, however, they would like to do a baptism sooner.  Please page as needs arise and we will aslo check in as we are able.  Chaplain Dyanne Carrel, Bcc Pager, [phone] 10:58 AM

## 2018-05-22 NOTE — Anesthesia Postprocedure Evaluation (Signed)
Anesthesia Post Note  Patient: Cheryl Barron  Procedure(s) Performed: AN AD HOC LABOR EPIDURAL     Patient location during evaluation: Mother Baby Anesthesia Type: Epidural Level of consciousness: awake Pain management: pain level controlled Vital Signs Assessment: post-procedure vital signs reviewed and stable Respiratory status: spontaneous breathing Cardiovascular status: stable Postop Assessment: patient able to bend at knees, epidural receding, no headache and no backache Anesthetic complications: no Comments: Phone postop    Last Vitals:  Vitals:   05/22/18 0438 05/22/18 0535  BP: 102/64 101/60  Pulse: 86 95  Resp: 16 18  Temp: 36.8 C (!) 36.4 C  SpO2: 100% 98%    Last Pain:  Vitals:   05/22/18 1000  TempSrc:   PainSc: 0-No pain   Pain Goal:                   Edison Pace

## 2018-05-23 MED ORDER — COCONUT OIL OIL: 1.0000 "application " | TOPICAL_OIL | 0 refills | Status: DC | PRN

## 2018-05-23 MED ORDER — ACETAMINOPHEN 325 MG PO TABS: 650.0000 mg | ORAL_TABLET | ORAL | Status: DC | PRN

## 2018-05-23 MED ORDER — TETANUS-DIPHTH-ACELL PERTUSSIS 5-2.5-18.5 LF-MCG/0.5 IM SUSP
0.5000 mL | Freq: Once | INTRAMUSCULAR | Status: AC
  Administered 2018-05-23: 10:00:00 0.5 mL via INTRAMUSCULAR
  Filled 2018-05-23: qty 0.5

## 2018-05-23 MED ORDER — IBUPROFEN 600 MG PO TABS: 600.0000 mg | ORAL_TABLET | Freq: Four times a day (QID) | ORAL | 0 refills | Status: DC

## 2018-05-23 MED ORDER — BENZOCAINE-MENTHOL 20-0.5 % EX AERO: 1.0000 "application " | INHALATION_SPRAY | CUTANEOUS | 2 refills | Status: DC | PRN

## 2018-05-23 NOTE — Progress Notes (Signed)
I offered support to [place] and [place] as they prepare for Cheryl Barron to be transferred to [facility].  They appear hopeful and also realistic and stated that they are grateful for having more options available to them at [facility].  I offered prayer, at their request, as well as ministry of presence.  Chaplain Dyanne Carrel, Bcc Pager, [phone] 11:01 AM    05/23/18 1000  Clinical Encounter Type  Visited With Patient and family together  Visit Type Spiritual support  Spiritual Encounters  Spiritual Needs Prayer;Emotional

## 2018-05-23 NOTE — Discharge Summary (Signed)
OB Discharge Summary  Patient Name: Cheryl Barron DOB: Aug 16, 1990 MRN: [id number]  Date of admission: 05/21/2018 Delivering provider: Noland Fordyce   Date of discharge: 05/23/2018  Admitting diagnosis: 35WKS CTX, BLEEDING Intrauterine pregnancy: [redacted]w[redacted]d     Secondary diagnosis:Principal Problem:   Postpartum care following vaginal delivery (4/23) Active Problems:   Indication for care in labor and delivery, antepartum   SVD (spontaneous vaginal delivery)   Fetal abnormality (dysplastic kidney bilaterally)  Additional problems:none     Discharge diagnosis:  Patient Active Problem List   Diagnosis Date Noted  . SVD (spontaneous vaginal delivery) 05/22/2018  . Postpartum care following vaginal delivery (4/23) 05/22/2018  . Fetal abnormality (dysplastic kidney bilaterally) 05/22/2018  . Indication for care in labor and delivery, antepartum 05/21/2018  . Abnormal glucose tolerance test (GTT) during pregnancy, antepartum 03/26/2018                                                                Post partum procedures:none  Augmentation: none Pain control: Epidural  Laceration:2nd degree;Perineal  Episiotomy:None  Complications: None   Hospital course:  Onset of Labor With Vaginal Delivery     28 y.o. yo G1P0101 at [redacted]w[redacted]d was admitted in Active Labor on 05/21/2018. Patient had an complicated labor course as follows: Breech presentation Membrane Rupture Time/Date:   ,    Intrapartum Procedures: Episiotomy: None [1]                                         Lacerations:  2nd degree [3];Perineal [11]  Patient had a delivery of a Viable infant. 05/22/2018  Information for the patient's newborn:  [place], Olguin [[id number]]  Delivery Method: Vaginal, Spontaneous(Filed from Delivery Summary)    Patient had an uncomplicated postpartum course.  She is ambulating, tolerating a regular diet, passing flatus, and urinating well. Patient is discharged home in stable condition on  05/23/18.   Physical exam  Vitals:   05/22/18 0535 05/22/18 1345 05/22/18 2304 05/23/18 0556  BP: 101/60 (!) 93/58 (!) 94/59 105/68  Pulse: 95 80 77 67  Resp: 18 18 18 18   Temp: (!) 97.5 F (36.4 C) 97.8 F (36.6 C) 97.7 F (36.5 C) (!) 97.5 F (36.4 C)  TempSrc: Oral Oral Oral Oral  SpO2: 98% 100% 98% 100%  Weight:      Height:       General: alert, cooperative and no distress Lochia: appropriate Uterine Fundus: firm Incision: Healing well with no significant drainage DVT Evaluation: No cords or calf tenderness. No significant calf/ankle edema. Labs: Lab Results  Component Value Date   WBC 11.5 (H) 05/21/2018   HGB 13.5 05/21/2018   HCT 41.4 05/21/2018   MCV 91.6 05/21/2018   PLT 252 05/21/2018   No flowsheet data found.  Vaccines: TDaP declined antepartum, considering prior to DC home form hospital         Flu    UTD  Discharge instruction: per After Visit Summary and "Baby and Me Booklet".  After Visit Meds:  Allergies as of 05/23/2018   No Known Allergies     Medication List    TAKE these medications   acetaminophen 325 MG tablet Commonly  known as:  Tylenol Take 2 tablets (650 mg total) by mouth every 4 (four) hours as needed (for pain scale < 4).   benzocaine-Menthol 20-0.5 % Aero Commonly known as:  DERMOPLAST Apply 1 application topically as needed for irritation (perineal discomfort).   coconut oil Oil Apply 1 application topically as needed.   ibuprofen 600 MG tablet Commonly known as:  ADVIL Take 1 tablet (600 mg total) by mouth every 6 (six) hours.   PRENATAL VITAMIN PO Take by mouth.            Discharge Care Instructions  (From admission, onward)         Start     Ordered   05/23/18 0000  Discharge wound care:    Comments:  Sitz baths 2 times /day with warm water x 1 week   05/23/18 0950          Diet: routine diet  Activity: Advance as tolerated. Pelvic rest for 6 weeks.   Postpartum contraception: Not  Discussed  Newborn Data: Live born female  Birth Weight: 3 lb 12 oz (1701 g) APGAR: 7, 9  Newborn Delivery   Birth date/time:  05/22/2018 02:02:00 Delivery type:  Vaginal, Spontaneous     named John Baby Feeding: EBM and supplemental feeds for prematurity  Disposition: transfer to [facility] for additional treatment of dysplastic kidneys   Delivery Report:  Review the Delivery Report for details.    Follow up: Follow-up Information    Noland FordyceFogleman, Kelly, MD. Schedule an appointment as soon as possible for a visit in 6 week(s).   Specialty:  Obstetrics and Gynecology Contact information: [address] [name] [address] [phone]             Signed: Cipriano MileDaniela C Paul, CNM, MSN 05/23/2018, 9:50 AM

## 2018-05-23 NOTE — Plan of Care (Signed)
Patient Appropriate for discharge  ?

## 2018-05-23 NOTE — Progress Notes (Signed)
Patient ID: Cheryl Barron, female   DOB: 04/25/1990, 28 y.o.   MRN: [id number] PPD # 1 S/P breech VD  Live born female  Birth Weight: 3 lb 12 oz (1701 g) APGAR: 7, 9  Newborn Delivery   Birth date/time:  05/22/2018 02:02:00 Delivery type:  Vaginal, Spontaneous NICU / dysplastic kidney bilaterally  New onset respiratory distress this am, may be transferred to another center for tx later today   Delivering provider: Noland Fordyce  Episiotomy:None   Lacerations:2nd degree;Perineal   Feeding: breast / expressing BM  Pain control at delivery: Epidural   S:  Reports feeling sore but well             Tolerating po/ No nausea or vomiting             Bleeding is light             Pain controlled with ibuprofen (OTC)             Up ad lib / ambulatory / voiding without difficulties   O:  A & O x 3, in no apparent distress              VS:  Vitals:   05/22/18 0535 05/22/18 1345 05/22/18 2304 05/23/18 0556  BP: 101/60 (!) 93/58 (!) 94/59 105/68  Pulse: 95 80 77 67  Resp: 18 18 18 18   Temp: (!) 97.5 F (36.4 C) 97.8 F (36.6 C) 97.7 F (36.5 C) (!) 97.5 F (36.4 C)  TempSrc: Oral Oral Oral Oral  SpO2: 98% 100% 98% 100%  Weight:      Height:        LABS:  Recent Labs    05/21/18 1411  WBC 11.5*  HGB 13.5  HCT 41.4  PLT 252    Blood type: --/--/A POS, A POS (04/22 1411)  Rubella: Immune (11/12 0000)   I&O: I/O last 3 completed shifts: In: -  Out: 1526 [Urine:1350; Blood:176]          No intake/output data recorded.  Vaccines: TDaP unknown         Flu    UTD   Lungs: Clear and unlabored  Heart: regular rate and rhythm / no murmurs  Abdomen: soft, non-tender, non-distended             Fundus: firm, non-tender, U-1  Perineum: repair intact, no edema  Lochia: small  Extremities: no edema, no calf pain or tenderness    A/P: PPD # 1 28 y.o., G1P0101   Principal Problem:   Postpartum care following vaginal delivery (4/23) Active Problems:   Indication for care in  labor and delivery, antepartum   SVD (spontaneous vaginal delivery)   Fetal abnormality (dysplastic kidney bilaterally)  - New onset respiratory distress this am, may be transferred to another center for tx later today  - will plan for maternal discharge if baby transferred today in order to follow baby to new hospital  Doing well - stable status  Routine post partum orders  TDaP recommended prior to DC             DC home today w/ instructions  F/U at [facility] in 6 weeks and PRN     Neta Mends, MSN, CNM 05/23/2018, 8:08 AM

## 2019-01-09 NOTE — Telephone Encounter (Signed)
Not my patient

## 2019-01-30 NOTE — L&D Delivery Note (Signed)
Delivery Note At  0813 a viable and healthy female was delivered over intact perineum via svd (Presentation:cephalic ; OA ).  APGAR: , ; weight  not yet done.   Placenta status: delivered, intact.  Cord:  3vv, with the following complications: none.  Anesthesia:  epidural Episiotomy:  none Lacerations:  2nd degree Suture Repair: 3.0 vicryl Est. Blood Loss (mL):   Mom to postpartum.  Baby to Couplet care / Skin to Skin.  Vick Frees 11/07/2019, 8:35 AM

## 2019-04-27 ENCOUNTER — Other Ambulatory Visit (HOSPITAL_COMMUNITY): Payer: Self-pay | Admitting: Obstetrics

## 2019-04-27 DIAGNOSIS — O09211 Supervision of pregnancy with history of pre-term labor, first trimester: Secondary | ICD-10-CM

## 2019-04-27 DIAGNOSIS — Z3A16 16 weeks gestation of pregnancy: Secondary | ICD-10-CM

## 2019-04-27 DIAGNOSIS — Z3689 Encounter for other specified antenatal screening: Secondary | ICD-10-CM

## 2019-05-25 ENCOUNTER — Other Ambulatory Visit: Payer: Self-pay

## 2019-06-04 ENCOUNTER — Encounter: Payer: Self-pay | Admitting: *Deleted

## 2019-06-08 ENCOUNTER — Other Ambulatory Visit: Payer: Self-pay

## 2019-06-08 ENCOUNTER — Ambulatory Visit (HOSPITAL_COMMUNITY): Payer: 59 | Attending: Obstetrics

## 2019-06-08 ENCOUNTER — Other Ambulatory Visit: Payer: Self-pay | Admitting: *Deleted

## 2019-06-08 ENCOUNTER — Ambulatory Visit: Payer: 59 | Admitting: *Deleted

## 2019-06-08 VITALS — BP 115/64 | HR 108 | Temp 97.9°F

## 2019-06-08 DIAGNOSIS — O09211 Supervision of pregnancy with history of pre-term labor, first trimester: Secondary | ICD-10-CM | POA: Diagnosis present

## 2019-06-08 DIAGNOSIS — O09292 Supervision of pregnancy with other poor reproductive or obstetric history, second trimester: Secondary | ICD-10-CM

## 2019-06-08 DIAGNOSIS — O0990 Supervision of high risk pregnancy, unspecified, unspecified trimester: Secondary | ICD-10-CM | POA: Insufficient documentation

## 2019-06-08 DIAGNOSIS — O09212 Supervision of pregnancy with history of pre-term labor, second trimester: Secondary | ICD-10-CM | POA: Diagnosis not present

## 2019-06-08 DIAGNOSIS — Z3689 Encounter for other specified antenatal screening: Secondary | ICD-10-CM | POA: Diagnosis present

## 2019-06-08 DIAGNOSIS — Z3A16 16 weeks gestation of pregnancy: Secondary | ICD-10-CM | POA: Insufficient documentation

## 2019-06-08 DIAGNOSIS — Z362 Encounter for other antenatal screening follow-up: Secondary | ICD-10-CM

## 2019-06-22 ENCOUNTER — Other Ambulatory Visit: Payer: Self-pay

## 2019-07-08 ENCOUNTER — Ambulatory Visit: Payer: 59 | Admitting: *Deleted

## 2019-07-08 ENCOUNTER — Other Ambulatory Visit: Payer: Self-pay

## 2019-07-08 ENCOUNTER — Ambulatory Visit: Payer: 59 | Attending: Obstetrics and Gynecology

## 2019-07-08 VITALS — BP 112/63 | HR 122

## 2019-07-08 DIAGNOSIS — O09292 Supervision of pregnancy with other poor reproductive or obstetric history, second trimester: Secondary | ICD-10-CM | POA: Diagnosis not present

## 2019-07-08 DIAGNOSIS — O0990 Supervision of high risk pregnancy, unspecified, unspecified trimester: Secondary | ICD-10-CM | POA: Diagnosis present

## 2019-07-08 DIAGNOSIS — Z3A20 20 weeks gestation of pregnancy: Secondary | ICD-10-CM

## 2019-07-08 DIAGNOSIS — Z362 Encounter for other antenatal screening follow-up: Secondary | ICD-10-CM | POA: Diagnosis not present

## 2019-07-08 DIAGNOSIS — O09212 Supervision of pregnancy with history of pre-term labor, second trimester: Secondary | ICD-10-CM

## 2019-08-24 ENCOUNTER — Other Ambulatory Visit: Payer: Self-pay | Admitting: Obstetrics

## 2019-08-24 DIAGNOSIS — O35EXX0 Maternal care for other (suspected) fetal abnormality and damage, fetal genitourinary anomalies, not applicable or unspecified: Secondary | ICD-10-CM

## 2019-08-24 DIAGNOSIS — Z3A27 27 weeks gestation of pregnancy: Secondary | ICD-10-CM

## 2019-08-24 DIAGNOSIS — Z8759 Personal history of other complications of pregnancy, childbirth and the puerperium: Secondary | ICD-10-CM

## 2019-08-30 ENCOUNTER — Other Ambulatory Visit: Payer: Self-pay

## 2019-08-30 ENCOUNTER — Emergency Department (HOSPITAL_COMMUNITY)
Admission: AD | Admit: 2019-08-30 | Discharge: 2019-08-30 | Disposition: A | Discharge status: Home / Self Care | Payer: 59 | Attending: Emergency Medicine | Admitting: Emergency Medicine

## 2019-08-30 ENCOUNTER — Encounter (HOSPITAL_COMMUNITY): Payer: Self-pay | Admitting: Obstetrics & Gynecology

## 2019-08-30 DIAGNOSIS — O4693 Antepartum hemorrhage, unspecified, third trimester: Secondary | ICD-10-CM | POA: Insufficient documentation

## 2019-08-30 DIAGNOSIS — Z6790 Unspecified blood type, Rh positive: Secondary | ICD-10-CM

## 2019-08-30 DIAGNOSIS — Z3A28 28 weeks gestation of pregnancy: Secondary | ICD-10-CM | POA: Diagnosis not present

## 2019-08-30 DIAGNOSIS — N939 Abnormal uterine and vaginal bleeding, unspecified: Secondary | ICD-10-CM | POA: Insufficient documentation

## 2019-08-30 LAB — URINALYSIS, ROUTINE W REFLEX MICROSCOPIC
Bilirubin Urine: NEGATIVE
Glucose, UA: NEGATIVE mg/dL
Ketones, ur: NEGATIVE mg/dL
Nitrite: NEGATIVE
Protein, ur: NEGATIVE mg/dL
Specific Gravity, Urine: 1.008 (ref 1.005–1.030)
pH: 6 (ref 5.0–8.0)

## 2019-08-30 NOTE — ED Provider Notes (Signed)
Patient placed in Quick Look pathway, seen and evaluated   Chief Complaint: vaginal bleeding  HPI:   Woke up this morning and had dark vaginal bleeding  ROS: vaginal bleeding, no lightheadedness or syncope, no abd pain (one)  Physical Exam:   Gen: No distress  Neuro: Awake and Alert  Skin: Warm    Focused Exam: gravid abd, no TTP on exam.   Initiation of care has begun. The patient has been counseled on the process, plan, and necessity for staying for the completion/evaluation, and the remainder of the medical screening examination  10:10 AM Discussed case with MAU APP who accepts patient to MAU for transfer.    Rayne Du 08/30/19 1010    Arby Barrette, MD 09/02/19 [phone]

## 2019-08-30 NOTE — MAU Note (Signed)
Cheryl Barron is a 29 y.o. at [redacted]w[redacted]d here in MAU reporting: woke up this morning with some bleeding. States it was not heavy and has not seen anymore. No pain, no LOF. +FM. No recent IC.  Onset of complaint: today  Pain score: 0/10  Vitals:   08/30/19 1003 08/30/19 1037  BP: 112/74 104/71  Pulse: 103 104  Resp: 17 16  Temp: 98.3 F (36.8 C) 98.5 F (36.9 C)  SpO2: 100% 100%     FHT: +FM  Lab orders placed from triage: UA

## 2019-08-30 NOTE — MAU Provider Note (Signed)
History     CSN: [id number]  Arrival date and time: 08/30/19 1027   None     Chief Complaint  Patient presents with  . Vaginal Bleeding   HPI  G2P0100 at [redacted]w[redacted]d who presents for vaginal bleeding. Patient's pregnancy complicated by A1GDM (recently diagnosed) and a prior history of term neonatal demise. She is Rh positive.   Patient reports noticing very light amount of blood around 9 am, no abdominal cramping. She thinks it was about a teaspoon of frank blood, no mucus. Denies abdominal pain and reports no contractions. Feeling baby move. no recent intercourse. No recent trauma or falls. No recent sicknesses. No dizziness or headaches.   Per chart review, patient has anterior placenta. She has a follow up ultrasound with MFM tomorrow.   Prior OB history notable for breech vaginal delivery at [redacted]w[redacted]d. Newborn with anhydramnios and bilateral dysplastic kidneys. Newborn passed shortly after delivery.      Past Medical History:  Diagnosis Date  . Female infertility   . GERD (gastroesophageal reflux disease)   . Gestational diabetes   . PCOS (polycystic ovarian syndrome)     Past Surgical History:  Procedure Laterality Date  . NO PAST SURGERIES      History reviewed. No pertinent family history.  Social History   Tobacco Use  . Smoking status: Never Smoker  . Smokeless tobacco: Never Used  Vaping Use  . Vaping Use: Never used  Substance Use Topics  . Alcohol use: Never  . Drug use: Never    Allergies: No Known Allergies  Medications Prior to Admission  Medication Sig Dispense Refill Last Dose  . acetaminophen (TYLENOL) 325 MG tablet Take 2 tablets (650 mg total) by mouth every 4 (four) hours as needed (for pain scale < 4). (Patient not taking: Reported on 06/08/2019)     . benzocaine-Menthol (DERMOPLAST) 20-0.5 % AERO Apply 1 application topically as needed for irritation (perineal discomfort). (Patient not taking: Reported on 06/08/2019) 1 each 2   . coconut oil OIL  Apply 1 application topically as needed. (Patient not taking: Reported on 06/08/2019)  0   . ibuprofen (ADVIL) 600 MG tablet Take 1 tablet (600 mg total) by mouth every 6 (six) hours. (Patient not taking: Reported on 06/08/2019) 30 tablet 0   . Prenatal Vit-Fe Fumarate-FA (PRENATAL VITAMIN PO) Take by mouth.       Review of Systems  Constitutional: Negative for activity change and appetite change.  Respiratory: Negative for shortness of breath.   Cardiovascular: Negative for chest pain.  Gastrointestinal: Negative for abdominal distention and abdominal pain.  Neurological: Negative for dizziness and headaches.   Physical Exam   Blood pressure 104/71, pulse 104, temperature 98.5 F (36.9 C), temperature source Oral, resp. rate 16, height 5' (1.524 m), weight 50.4 kg, last menstrual period 02/08/2019, SpO2 100 %, unknown if currently breastfeeding.  Physical Exam Vitals and nursing note reviewed. Exam conducted with a chaperone present.  Constitutional:      Appearance: Normal appearance.  Cardiovascular:     Rate and Rhythm: Normal rate and regular rhythm.  Pulmonary:     Effort: Pulmonary effort is normal.  Abdominal:     Palpations: Abdomen is soft.     Tenderness: There is no abdominal tenderness.  Genitourinary:    Comments: No bleeding visualized. No blood in vault. Physiologic discharge. Cervix is visually closed.   Of note, patient has difficulty with speculum exams.  Skin:    General: Skin is warm.  Neurological:  Mental Status: She is alert.  Psychiatric:        Mood and Affect: Mood normal.        Behavior: Behavior normal.     MAU Course  Procedures  MDM -patient evaluated at bedside. Prenatal chart reviewed in media.  -speculum exam performed with chaperone at bedside. No bleeding visualized.  -reactive NST, rh positive   Assessment and Plan   Vaginal Bleeding -appears resolved, no old blood or active bleeding appreciated on exam. Anterior placenta. Rh  positive.  -has f/u US tomorrow w MFM  -provided w return precautions. Stable for discharge home.   Gita Kudo 08/30/2019, 11:02 AM  OB Family Medicine Fellow, [facility] for [employer], [facility]

## 2019-08-31 ENCOUNTER — Other Ambulatory Visit: Payer: Self-pay | Admitting: *Deleted

## 2019-08-31 ENCOUNTER — Ambulatory Visit: Payer: 59 | Admitting: *Deleted

## 2019-08-31 ENCOUNTER — Other Ambulatory Visit: Payer: Self-pay | Admitting: Obstetrics

## 2019-08-31 ENCOUNTER — Ambulatory Visit: Payer: 59 | Attending: Obstetrics

## 2019-08-31 VITALS — BP 109/70 | HR 105

## 2019-08-31 DIAGNOSIS — Z8759 Personal history of other complications of pregnancy, childbirth and the puerperium: Secondary | ICD-10-CM

## 2019-08-31 DIAGNOSIS — Z3A27 27 weeks gestation of pregnancy: Secondary | ICD-10-CM | POA: Diagnosis present

## 2019-08-31 DIAGNOSIS — O35EXX0 Maternal care for other (suspected) fetal abnormality and damage, fetal genitourinary anomalies, not applicable or unspecified: Secondary | ICD-10-CM

## 2019-08-31 DIAGNOSIS — O24410 Gestational diabetes mellitus in pregnancy, diet controlled: Secondary | ICD-10-CM | POA: Diagnosis present

## 2019-08-31 DIAGNOSIS — O358XX0 Maternal care for other (suspected) fetal abnormality and damage, not applicable or unspecified: Secondary | ICD-10-CM

## 2019-08-31 DIAGNOSIS — O09213 Supervision of pregnancy with history of pre-term labor, third trimester: Secondary | ICD-10-CM

## 2019-08-31 DIAGNOSIS — Z3A28 28 weeks gestation of pregnancy: Secondary | ICD-10-CM

## 2019-08-31 DIAGNOSIS — O283 Abnormal ultrasonic finding on antenatal screening of mother: Secondary | ICD-10-CM | POA: Diagnosis not present

## 2019-08-31 DIAGNOSIS — O09293 Supervision of pregnancy with other poor reproductive or obstetric history, third trimester: Secondary | ICD-10-CM | POA: Diagnosis not present

## 2019-10-06 ENCOUNTER — Encounter: Payer: Self-pay | Admitting: *Deleted

## 2019-10-06 ENCOUNTER — Ambulatory Visit: Payer: 59 | Attending: Obstetrics

## 2019-10-06 ENCOUNTER — Other Ambulatory Visit: Payer: Self-pay

## 2019-10-06 ENCOUNTER — Ambulatory Visit: Payer: 59 | Admitting: *Deleted

## 2019-10-06 VITALS — BP 104/68 | HR 94

## 2019-10-06 DIAGNOSIS — O283 Abnormal ultrasonic finding on antenatal screening of mother: Secondary | ICD-10-CM | POA: Diagnosis not present

## 2019-10-06 DIAGNOSIS — Z362 Encounter for other antenatal screening follow-up: Secondary | ICD-10-CM

## 2019-10-06 DIAGNOSIS — O09213 Supervision of pregnancy with history of pre-term labor, third trimester: Secondary | ICD-10-CM | POA: Diagnosis not present

## 2019-10-06 DIAGNOSIS — O09293 Supervision of pregnancy with other poor reproductive or obstetric history, third trimester: Secondary | ICD-10-CM

## 2019-10-06 DIAGNOSIS — O24410 Gestational diabetes mellitus in pregnancy, diet controlled: Secondary | ICD-10-CM

## 2019-10-06 DIAGNOSIS — Z3A33 33 weeks gestation of pregnancy: Secondary | ICD-10-CM

## 2019-10-07 ENCOUNTER — Other Ambulatory Visit: Payer: Self-pay | Admitting: *Deleted

## 2019-10-07 DIAGNOSIS — O24410 Gestational diabetes mellitus in pregnancy, diet controlled: Secondary | ICD-10-CM

## 2019-10-22 LAB — OB RESULTS CONSOLE GBS: GBS: NEGATIVE

## 2019-11-03 ENCOUNTER — Ambulatory Visit: Payer: 59

## 2019-11-07 ENCOUNTER — Inpatient Hospital Stay (HOSPITAL_COMMUNITY)
Admission: AD | Admit: 2019-11-07 | Discharge: 2019-11-09 | DRG: 806 | Disposition: A | Discharge status: Home / Self Care | Payer: 59 | Attending: Obstetrics and Gynecology | Admitting: Obstetrics and Gynecology

## 2019-11-07 ENCOUNTER — Inpatient Hospital Stay (HOSPITAL_COMMUNITY): Payer: 59 | Admitting: Anesthesiology

## 2019-11-07 ENCOUNTER — Encounter (HOSPITAL_COMMUNITY): Payer: Self-pay | Admitting: Obstetrics and Gynecology

## 2019-11-07 ENCOUNTER — Other Ambulatory Visit: Payer: Self-pay

## 2019-11-07 DIAGNOSIS — Z20822 Contact with and (suspected) exposure to covid-19: Secondary | ICD-10-CM | POA: Diagnosis present

## 2019-11-07 DIAGNOSIS — O24420 Gestational diabetes mellitus in childbirth, diet controlled: Principal | ICD-10-CM | POA: Diagnosis present

## 2019-11-07 DIAGNOSIS — Z3A38 38 weeks gestation of pregnancy: Secondary | ICD-10-CM

## 2019-11-07 DIAGNOSIS — D62 Acute posthemorrhagic anemia: Secondary | ICD-10-CM | POA: Diagnosis not present

## 2019-11-07 DIAGNOSIS — O9081 Anemia of the puerperium: Secondary | ICD-10-CM | POA: Diagnosis not present

## 2019-11-07 DIAGNOSIS — O358XX0 Maternal care for other (suspected) fetal abnormality and damage, not applicable or unspecified: Secondary | ICD-10-CM | POA: Diagnosis present

## 2019-11-07 DIAGNOSIS — O26893 Other specified pregnancy related conditions, third trimester: Secondary | ICD-10-CM | POA: Diagnosis present

## 2019-11-07 LAB — CBC
HCT: 41.1 % (ref 36.0–46.0)
Hemoglobin: 13.7 g/dL (ref 12.0–15.0)
MCH: 30.9 pg (ref 26.0–34.0)
MCHC: 33.3 g/dL (ref 30.0–36.0)
MCV: 92.8 fL (ref 80.0–100.0)
Platelets: 170 10*3/uL (ref 150–400)
RBC: 4.43 MIL/uL (ref 3.87–5.11)
RDW: 14.3 % (ref 11.5–15.5)
WBC: 7.6 10*3/uL (ref 4.0–10.5)
nRBC: 0 % (ref 0.0–0.2)

## 2019-11-07 LAB — GLUCOSE, CAPILLARY
Glucose-Capillary: 112 mg/dL — ABNORMAL HIGH (ref 70–99)
Glucose-Capillary: 99 mg/dL (ref 70–99)

## 2019-11-07 LAB — TYPE AND SCREEN
ABO/RH(D): A POS
Antibody Screen: NEGATIVE

## 2019-11-07 LAB — RESPIRATORY PANEL BY RT PCR (FLU A&B, COVID)
Influenza A by PCR: NEGATIVE
Influenza B by PCR: NEGATIVE
SARS Coronavirus 2 by RT PCR: NEGATIVE

## 2019-11-07 LAB — OB RESULTS CONSOLE GBS: GBS: NEGATIVE

## 2019-11-07 LAB — RPR: RPR Ser Ql: NONREACTIVE

## 2019-11-07 IMAGING — US US MFM OB DETAIL+14 WK
1 series · 13 of 28 positions shown · non-contrast
Comparison: none

[Series 1: us mfm ob detail+14 wk · 13 of 93 slices shown]
[im 4/93]
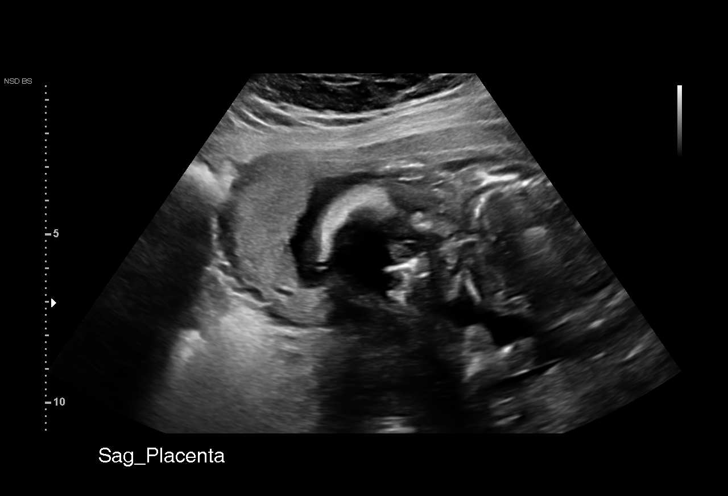
[im 11/93]
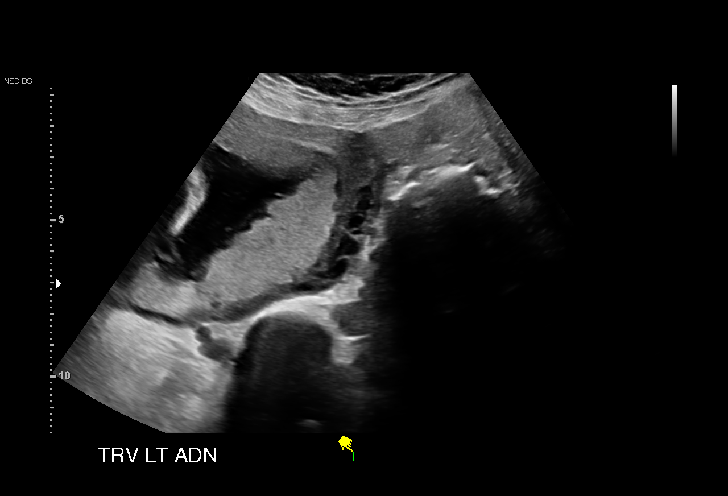
[im 18/93]
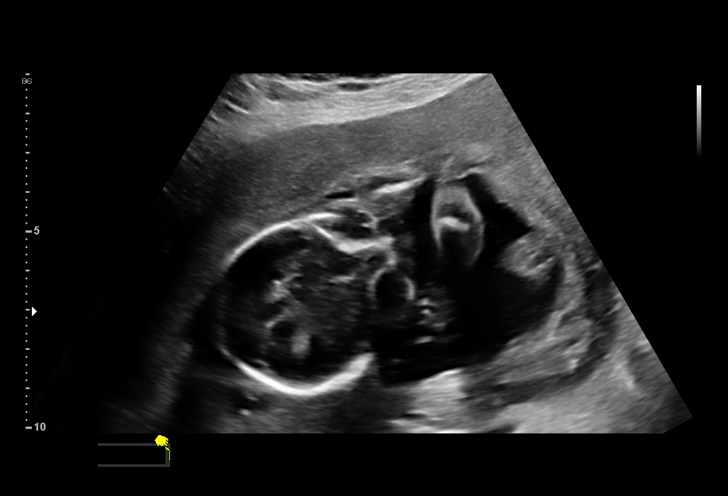
[im 24/93]
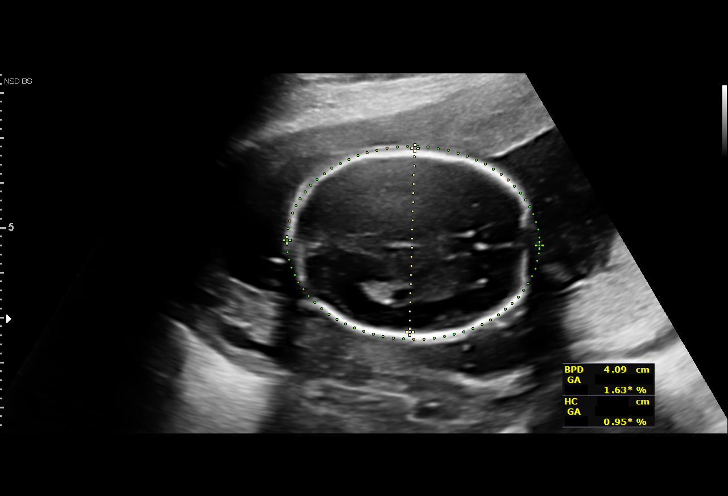
[im 31/93]
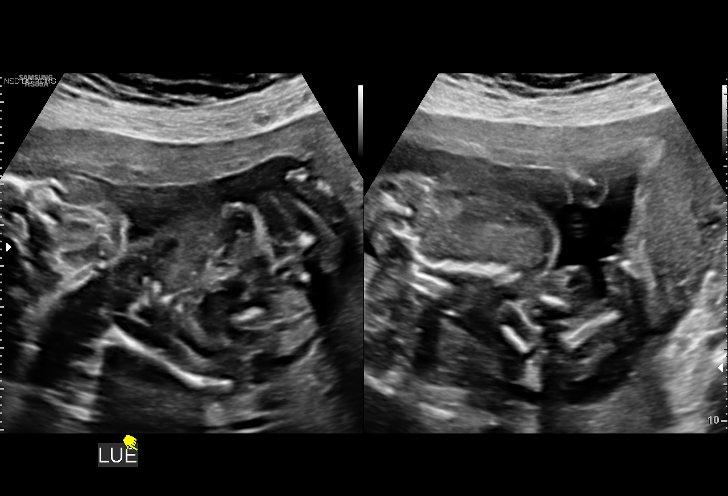
[im 38/93]
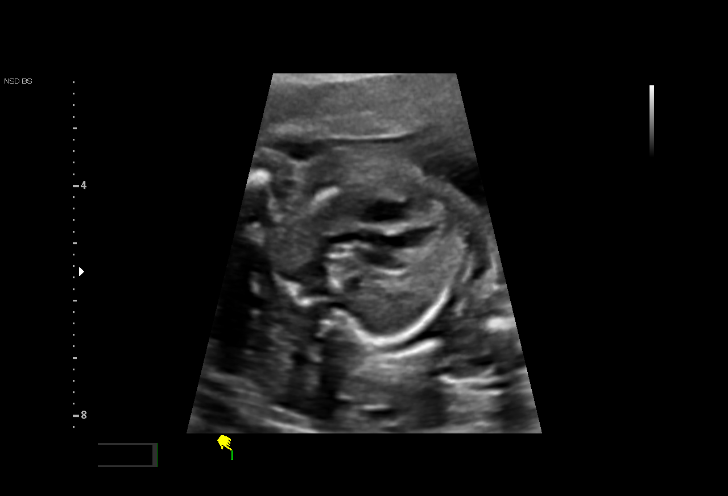
[im 48/93]
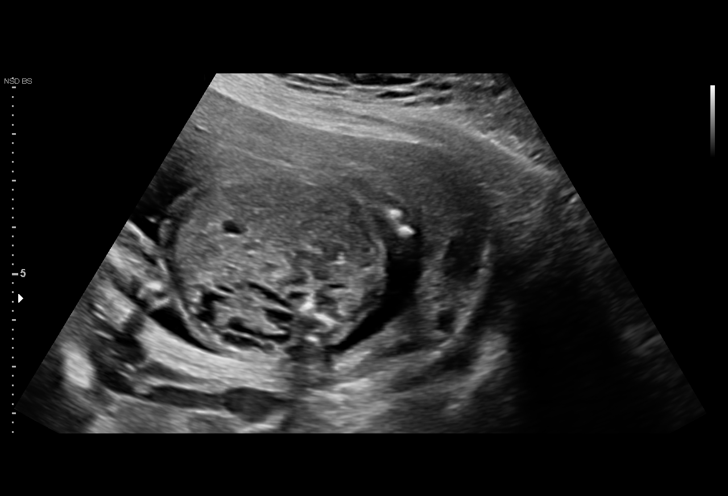
[im 55/93]
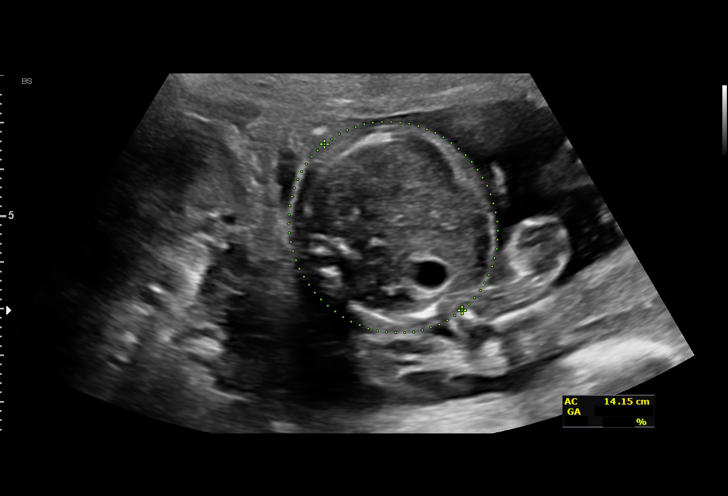
[im 62/93]
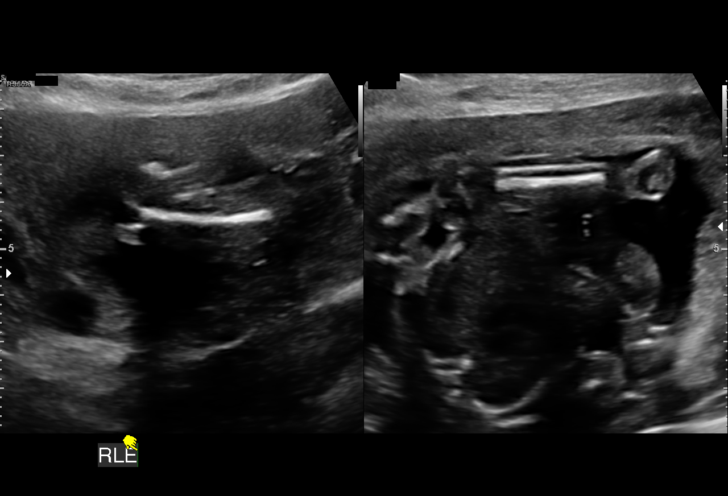
[im 69/93]
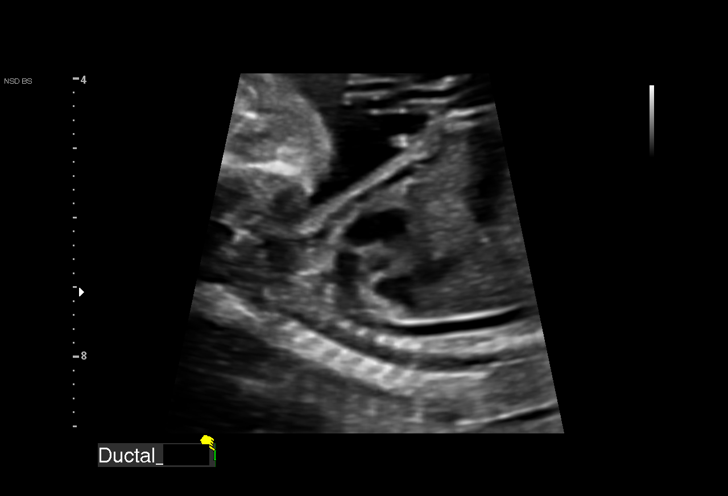
[im 75/93]
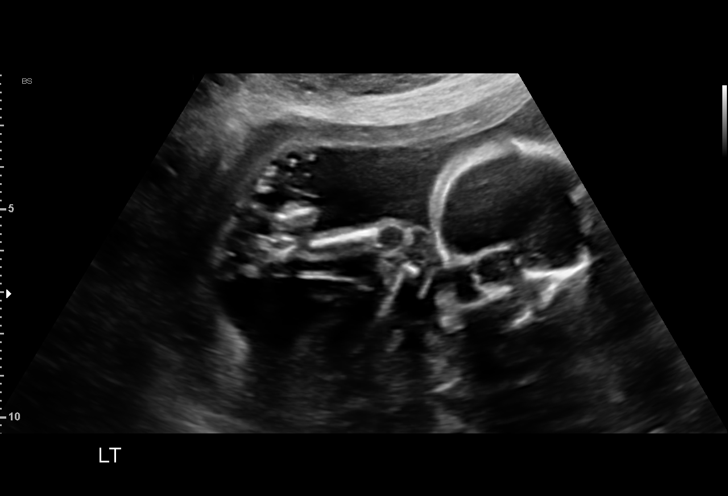
[im 82/93]
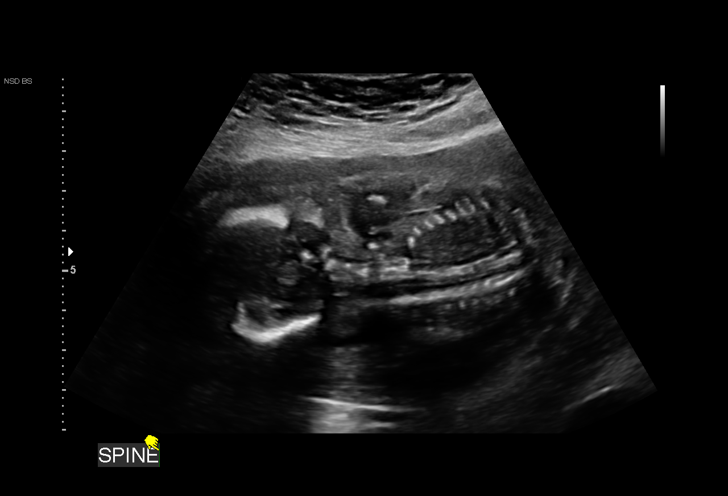
[im 89/93]
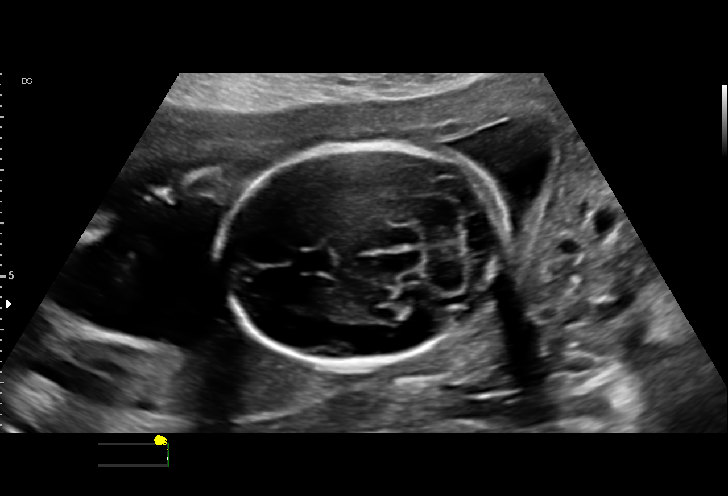

[13 of 28 positions shown; findings below may reference images not displayed]

OB/GYN &
                                                             Infertility
                                                             [id number] [HOSPITAL]

 ----------------------------------------------------------------------

 ----------------------------------------------------------------------
Indications

  Encounter for antenatal screening for
  malformations
  20 weeks gestation of pregnancy
  Congenital anomaly of fetal kidney (Left
  Multicystic Kidney)
 ----------------------------------------------------------------------
Fetal Evaluation

 Num Of Fetuses:          1
 Fetal Heart              167
 Rate(bpm):
 Cardiac Activity:        Observed
 Presentation:            Breech
 Placenta:                Posterior
 P. Cord Insertion:       Not well visualized

 Amniotic Fluid
 AFI FV:      Within normal limits

                             Largest Pocket(cm)

Biometry

 BPD:      40.5  mm     G. Age:  18w 2d          2  %    CI:         67.92  %    70 - 86
                                                         FL/HC:       18.2  %    16.8 -
 HC:      157.2  mm     G. Age:  18w 5d        < 3  %    HC/AC:       1.07       1.09 -
 AC:      146.4  mm     G. Age:  20w 0d         38  %    FL/BPD:      70.6  %
 FL:       28.6  mm     G. Age:  18w 5d          7  %    FL/AC:       19.5  %    20 - 24
 HUM:      26.7  mm     G. Age:  18w 3d          8  %
 CER:      20.8  mm     G. Age:  19w 6d         42  %
 NFT:       2.4  mm

 LV:        7.7  mm
 CM:        4.5  mm

 Est. FW:     284   g    0 lb 10 oz      31  %
                    m
OB History

 Gravidity:    1         Term:   0        Prem:   0         SAB:   0
 TOP:          0       Ectopic:  0        Living: 0
Gestational Age

 LMP:           20w 1d        Date:  09/17/17                 EDD:    06/24/18
 U/S Today:     19w 0d                                        EDD:    07/02/18
 Best:          20w 1d     Det. By:  LMP  (09/17/17)          EDD:    06/24/18
Anatomy

 Cranium:               Appears normal         LVOT:                   Appears normal
 Cavum:                 Appears normal         Aortic Arch:            Appears normal
 Ventricles:            Appears normal         Ductal Arch:            Appears normal
 Choroid Plexus:        Appears normal         Diaphragm:              Appears normal
 Cerebellum:            Appears normal         Stomach:                Appears normal,
                                                                       left sided
 Posterior Fossa:       Appears normal         Abdomen:                Appears normal
 Nuchal Fold:           Not applicable (>20    Abdominal Wall:         Appears nml (cord
                        wks GA)                                        insert, abd wall)
 Face:                  Appears normal         Cord Vessels:           Appears normal (3
                        (orbits and profile)                           vessel cord)
 Lips:                  Appears normal         Kidneys:                LEFT Multicystic
                                                                       dysplastic
 Palate:                Not well visualized    Bladder:                Appears normal
 Thoracic:              Appears normal         Spine:                  Limited views
                                                                       appear normal
 Heart:                 Appears normal         Upper Extremities:      Visualized
                        (4CH, axis, and
                        situs
 RVOT:                  Appears normal         Lower Extremities:      Visualized

 Other:  Fetus appears to be a male. Heels visualized. Nasal bone visualized.
         Technically difficult due to fetal position.
Cervix Uterus Adnexa

 Cervix
 Length:           3.07  cm.
 Normal appearance by transabdominal scan.

 Left Ovary
 Not visualized.

 Right Ovary
 Not visualized.
 Adnexa
 No abnormality visualized.
Impression

 Normal interval growth.
 Unilateral multicystic kidney
 Suboptimal spine views
Recommendations

 Follow up anatomy in 5-6 weeks
 Assess kidney's every 4-6 weeks.

## 2019-11-07 MED ORDER — OXYCODONE-ACETAMINOPHEN 5-325 MG PO TABS: 1.0000 | ORAL_TABLET | ORAL | Status: DC | PRN

## 2019-11-07 MED ORDER — OXYCODONE-ACETAMINOPHEN 5-325 MG PO TABS: 2.0000 | ORAL_TABLET | ORAL | Status: DC | PRN

## 2019-11-07 MED ORDER — SODIUM CHLORIDE (PF) 0.9 % IJ SOLN
INTRAMUSCULAR | Status: DC | PRN
Start: 2019-11-07 — End: 2019-11-07
  Administered 2019-11-07: 12 mL/h via EPIDURAL

## 2019-11-07 MED ORDER — SENNOSIDES-DOCUSATE SODIUM 8.6-50 MG PO TABS
2.0000 | ORAL_TABLET | ORAL | Status: DC
  Administered 2019-11-07 – 2019-11-08 (×2): 2 via ORAL
  Filled 2019-11-07 (×2): qty 2

## 2019-11-07 MED ORDER — WITCH HAZEL-GLYCERIN EX PADS: 1.0000 "application " | MEDICATED_PAD | CUTANEOUS | Status: DC | PRN

## 2019-11-07 MED ORDER — EPHEDRINE 5 MG/ML INJ: 10.0000 mg | INTRAVENOUS | Status: DC | PRN

## 2019-11-07 MED ORDER — COCONUT OIL OIL
1.0000 "application " | TOPICAL_OIL | Status: DC | PRN
  Administered 2019-11-07: 1 via TOPICAL

## 2019-11-07 MED ORDER — OXYTOCIN BOLUS FROM INFUSION
333.0000 mL | Freq: Once | INTRAVENOUS | Status: AC
  Administered 2019-11-07: 333 mL via INTRAVENOUS

## 2019-11-07 MED ORDER — LIDOCAINE HCL (PF) 1 % IJ SOLN
INTRAMUSCULAR | Status: DC | PRN
  Administered 2019-11-07: 8 mL via EPIDURAL
  Administered 2019-11-07: 3 mL via EPIDURAL

## 2019-11-07 MED ORDER — LACTATED RINGERS IV SOLN: 500.0000 mL | Freq: Once | INTRAVENOUS | Status: DC

## 2019-11-07 MED ORDER — PRENATAL MULTIVITAMIN CH
1.0000 | ORAL_TABLET | Freq: Every day | ORAL | Status: DC
  Administered 2019-11-08 – 2019-11-09 (×2): 1 via ORAL
  Filled 2019-11-07 (×3): qty 1

## 2019-11-07 MED ORDER — SOD CITRATE-CITRIC ACID 500-334 MG/5ML PO SOLN: 30.0000 mL | ORAL | Status: DC | PRN

## 2019-11-07 MED ORDER — BENZOCAINE-MENTHOL 20-0.5 % EX AERO
1.0000 "application " | INHALATION_SPRAY | CUTANEOUS | Status: DC | PRN
  Administered 2019-11-07: 1 via TOPICAL
  Filled 2019-11-07: qty 56

## 2019-11-07 MED ORDER — ONDANSETRON HCL 4 MG PO TABS: 4.0000 mg | ORAL_TABLET | ORAL | Status: DC | PRN

## 2019-11-07 MED ORDER — PHENYLEPHRINE 40 MCG/ML (10ML) SYRINGE FOR IV PUSH (FOR BLOOD PRESSURE SUPPORT): 80.0000 ug | PREFILLED_SYRINGE | INTRAVENOUS | Status: DC | PRN

## 2019-11-07 MED ORDER — LACTATED RINGERS IV SOLN: 500.0000 mL | INTRAVENOUS | Status: DC | PRN

## 2019-11-07 MED ORDER — DIBUCAINE (PERIANAL) 1 % EX OINT: 1.0000 "application " | TOPICAL_OINTMENT | CUTANEOUS | Status: DC | PRN

## 2019-11-07 MED ORDER — FENTANYL-BUPIVACAINE-NACL 0.5-0.125-0.9 MG/250ML-% EP SOLN
12.0000 mL/h | EPIDURAL | Status: DC | PRN
  Filled 2019-11-07: qty 250

## 2019-11-07 MED ORDER — OXYTOCIN-SODIUM CHLORIDE 30-0.9 UT/500ML-% IV SOLN
2.5000 [IU]/h | INTRAVENOUS | Status: DC
  Filled 2019-11-07: qty 500

## 2019-11-07 MED ORDER — IBUPROFEN 600 MG PO TABS
600.0000 mg | ORAL_TABLET | Freq: Four times a day (QID) | ORAL | Status: DC
  Administered 2019-11-07 – 2019-11-09 (×8): 600 mg via ORAL
  Filled 2019-11-07 (×9): qty 1

## 2019-11-07 MED ORDER — FLEET ENEMA 7-19 GM/118ML RE ENEM: 1.0000 | ENEMA | RECTAL | Status: DC | PRN

## 2019-11-07 MED ORDER — LIDOCAINE HCL (PF) 1 % IJ SOLN: 30.0000 mL | INTRAMUSCULAR | Status: DC | PRN

## 2019-11-07 MED ORDER — DIPHENHYDRAMINE HCL 50 MG/ML IJ SOLN: 12.5000 mg | INTRAMUSCULAR | Status: DC | PRN

## 2019-11-07 MED ORDER — ACETAMINOPHEN 325 MG PO TABS
650.0000 mg | ORAL_TABLET | ORAL | Status: DC | PRN
  Administered 2019-11-07 – 2019-11-09 (×3): 650 mg via ORAL
  Filled 2019-11-07 (×3): qty 2

## 2019-11-07 MED ORDER — ONDANSETRON HCL 4 MG/2ML IJ SOLN: 4.0000 mg | INTRAMUSCULAR | Status: DC | PRN

## 2019-11-07 MED ORDER — DIPHENHYDRAMINE HCL 25 MG PO CAPS: 25.0000 mg | ORAL_CAPSULE | Freq: Four times a day (QID) | ORAL | Status: DC | PRN

## 2019-11-07 MED ORDER — ONDANSETRON HCL 4 MG/2ML IJ SOLN: 4.0000 mg | Freq: Four times a day (QID) | INTRAMUSCULAR | Status: DC | PRN

## 2019-11-07 MED ORDER — SIMETHICONE 80 MG PO CHEW: 80.0000 mg | CHEWABLE_TABLET | ORAL | Status: DC | PRN

## 2019-11-07 MED ORDER — LACTATED RINGERS IV SOLN: INTRAVENOUS | Status: DC

## 2019-11-07 MED ORDER — TETANUS-DIPHTH-ACELL PERTUSSIS 5-2.5-18.5 LF-MCG/0.5 IM SUSP: 0.5000 mL | Freq: Once | INTRAMUSCULAR | Status: DC

## 2019-11-07 MED ORDER — ACETAMINOPHEN 325 MG PO TABS: 650.0000 mg | ORAL_TABLET | ORAL | Status: DC | PRN

## 2019-11-07 NOTE — MAU Note (Signed)
PT SAYS [facility] STRONG 2300. [facility]- WITH DR [facility]- VE 4 CM.  DENIES HSV AND MRSA. GBS- NEG

## 2019-11-07 NOTE — Lactation Note (Signed)
This note was copied from a baby's chart. Lactation Consultation Note  Patient Name: Cheryl Barron [id number] Date: 11/07/2019 Reason for consult: Initial assessment;Early term 37-38.6wks  Initial visit to 10 hours old ETI of a P1 mother. Baby is sleeping in basinet upon arrival. Mother stated infant has breastfed well twice. Mother reported she tried to latch recently but infant was very sleepy. Encouraged parents to have baby skin to skin and/or unwrap baby and change diaper to wake him up. Discussed with mother baby's feeding cues. Encourage to follow babies' hunger and fullness cues. Reviewed importance to offer the breast 8 to 12 times in a 24-hour period for proper stimulation and to establish good milk supply. Talked about hand expression and demonstrated technique. Colostrum is easily expressed and collected ~41mL and finger-fed to infant.    Reviewed with mother average size of a NB stomach. Reviewed colostrum benefits for baby. Reviewed breastfeeding basics. Discussed milk coming to volume. Reviewed newborn behavior, feeding patterns and expectations with mother. Mother explains her nipples are sensitive to touch. Talked to mother about the benefits of a deep latch to prevent nipple pain. Encouraged to contact [facility] for support when ready to breastfeed baby and recommended to request help for questions or concerns.     Maternal Data Formula Feeding for Exclusion: No Has patient been taught Hand Expression?: Yes Does the patient have breastfeeding experience prior to this delivery?: No  Feeding Feeding Type: Breast Milk  LATCH Score Latch: Too sleepy or reluctant, no latch achieved, no sucking elicited.  Audible Swallowing: None  Type of Nipple: Everted at rest and after stimulation  Comfort (Breast/Nipple): Soft / non-tender (sensitive)  Hold (Positioning): Assistance needed to correctly position infant at breast and maintain latch.  LATCH Score:  5  Interventions Interventions: Breast feeding basics reviewed;Assisted with latch;Skin to skin;Breast massage;Hand express;Adjust position;Support pillows;Expressed milk  Lactation Tools Discussed/Used WIC Program: No   Consult Status Consult Status: Follow-up Date: 11/08/19 Follow-up type: In-patient    Cheryl Barron [name] 11/07/2019, 6:20 PM

## 2019-11-07 NOTE — Anesthesia Procedure Notes (Signed)
Epidural Patient location during procedure: OB Start time: 11/07/2019 4:20 AM End time: 11/07/2019 4:22 AM  Staffing Anesthesiologist: Leilani Able, MD Performed: anesthesiologist   Preanesthetic Checklist Completed: patient identified, IV checked, site marked, risks and benefits discussed, surgical consent, monitors and equipment checked, pre-op evaluation and timeout performed  Epidural Patient position: sitting Prep: DuraPrep and site prepped and draped Patient monitoring: continuous pulse ox and blood pressure Approach: midline Location: L3-L4 Injection technique: LOR air  Needle:  Needle type: Tuohy  Needle gauge: 17 G Needle length: 9 cm and 9 Needle insertion depth: 4 cm Catheter type: closed end flexible Catheter size: 19 Gauge Catheter at skin depth: 9 cm Test dose: negative and Other  Assessment Events: blood not aspirated, injection not painful, no injection resistance, no paresthesia and negative IV test  Additional Notes Reason for block:procedure for pain

## 2019-11-07 NOTE — H&P (Signed)
Cheryl Barron is a 29 y.o. G2P0100 at [redacted]w[redacted]d gestation presents for complaint of Contractions.  Denies lof, vb.  +FM  Antepartum course: gdma1, diet controlled; h/o fetus with anomalies and neonatal death; poor growth - 5'11"  (10/1); bilateral fetal pyelectasis [facility] since 9 wks.  See complete pre-natal records  History OB History    Gravida  2   Para  1   Term      Preterm  1   AB      Living  0     SAB      TAB      Ectopic      Multiple  0   Live Births  1          Past Medical History:  Diagnosis Date  . Female infertility   . GERD (gastroesophageal reflux disease)   . Gestational diabetes   . PCOS (polycystic ovarian syndrome)    Past Surgical History:  Procedure Laterality Date  . NO PAST SURGERIES     Family History: family history is not on file. Social History:  reports that she has never smoked. She has never used smokeless tobacco. She reports that she does not drink alcohol and does not use drugs.  ROS: See above otherwise negative  Prenatal labs:  ABO, Rh: --/--/A POS (10/09 0253) Antibody: NEG (10/09 0253) Rubella:  immune RPR:   neg HBsAg:   nr HIV:  nr GBS:   neg 1 hr Glucola: abnml, 3hr gtt neg Genetic screening: Normal Anatomy US: Normal  Physical Exam:   Dilation: 9 Effacement (%): 90 Station: -1 Exam by:: D. BRown, RN Blood pressure 102/70, pulse 86, temperature 98.1 F (36.7 C), temperature source Oral, resp. rate 16, height 5' (1.524 m), weight 51.3 kg, last menstrual period 02/08/2019, unknown if currently breastfeeding. A&O x 3 HEENT: Normal Lungs: CTAB CV: RRR Abdominal: Soft, Non-tender, Gravid and Estimated fetal weight: 5 1/2 lbs lbs  Lower Extremities: Non-edematous, Non-tender  Pelvic Exam:      Dilatation: 10cm     Effacement: 100%     Station: +1     Presentation: Cephalic; arom with clear fluid  Labs:  CBC:  Lab Results  Component Value Date   WBC 7.6 11/07/2019   RBC  4.43 11/07/2019   HGB 13.7 11/07/2019   HCT 41.1 11/07/2019   MCV 92.8 11/07/2019   MCH 30.9 11/07/2019   MCHC 33.3 11/07/2019   RDW 14.3 11/07/2019   PLT 170 11/07/2019   CMP: No results found for: NA, K, CL, CO2, GLUCOSE, BUN, CREATININE, CALCIUM, PROT, AST, ALT, ALBUMIN, ALKPHOS, BILITOT, GFRNONAA, GFRAA, ANIONGAP Urine: Lab Results  Component Value Date   COLORURINE STRAW (A) 08/30/2019   APPEARANCEUR CLEAR 08/30/2019   LABSPEC 1.008 08/30/2019   PHURINE 6.0 08/30/2019   GLUCOSEU NEGATIVE 08/30/2019   HGBUR SMALL (A) 08/30/2019   BILIRUBINUR NEGATIVE 08/30/2019   KETONESUR NEGATIVE 08/30/2019   PROTEINUR NEGATIVE 08/30/2019   NITRITE NEGATIVE 08/30/2019   LEUKOCYTESUR MODERATE (A) 08/30/2019     Prenatal Transfer Tool  Maternal Diabetes: Yes:  Diabetes Type:  Diet controlled Genetic Screening: Normal Maternal Ultrasounds/Referrals: bilateral renal pyelectasis Fetal Ultrasounds or other Referrals:  None Maternal Substance Abuse:  No Significant Maternal Medications:  None Significant Maternal Lab Results: Group B Strep negative  FHT: 120s, nml variaibility, +accels, no decels Toco: 1-53min  Assessment/Plan:  29 y.o. G2P0100 at [redacted]w[redacted]d gestation   1. Active labor - anticipate svd soon 2. Fetal status  reassuring, efw 5'11" 1 wk ago  3. gbs neg 4. Poor ob history - s/p breech svd at 37wga, fetus with anomalies and neonatal death; normal anatomy and neg NIPT this pregnancy 5. Rh pos 6. RI 7. GDMA1 - controlled on diet; fsbs on admission 122 (last ate 11 hrs ago); plan check fasting tomorrow am 8. Bilateral renal pyelectasis  Vick Frees 11/07/2019, 5:41 AM

## 2019-11-07 NOTE — Plan of Care (Signed)
  Problem: Education: Goal: Knowledge of General Education information will improve Description Including pain rating scale, medication(s)/side effects and non-pharmacologic comfort measures Outcome: Completed/Met   Problem: Health Behavior/Discharge Planning: Goal: Ability to manage health-related needs will improve Outcome: Completed/Met   Problem: Clinical Measurements: Goal: Ability to maintain clinical measurements within normal limits will improve Outcome: Completed/Met Goal: Will remain free from infection Outcome: Completed/Met Goal: Diagnostic test results will improve Outcome: Completed/Met Goal: Cardiovascular complication will be avoided Outcome: Completed/Met   Problem: Activity: Goal: Risk for activity intolerance will decrease Outcome: Completed/Met   Problem: Nutrition: Goal: Adequate nutrition will be maintained Outcome: Completed/Met   Problem: Coping: Goal: Level of anxiety will decrease Outcome: Completed/Met   Problem: Elimination: Goal: Will not experience complications related to bowel motility Outcome: Completed/Met Goal: Will not experience complications related to urinary retention Outcome: Completed/Met   Problem: Pain Managment: Goal: General experience of comfort will improve Outcome: Completed/Met   Problem: Safety: Goal: Ability to remain free from injury will improve Outcome: Completed/Met   Problem: Skin Integrity: Goal: Risk for impaired skin integrity will decrease Outcome: Completed/Met   

## 2019-11-07 NOTE — Anesthesia Preprocedure Evaluation (Signed)
Anesthesia Evaluation  Patient identified by MRN, date of birth, ID band Patient awake    Reviewed: Allergy & Precautions, NPO status , Patient's Chart, lab work & pertinent test results  Airway Mallampati: II       Dental no notable dental hx.    Pulmonary neg pulmonary ROS,    Pulmonary exam normal        Cardiovascular negative cardio ROS Normal cardiovascular exam     Neuro/Psych negative neurological ROS  negative psych ROS   GI/Hepatic Neg liver ROS,   Endo/Other  negative endocrine ROSdiabetes, Gestational  Renal/GU negative Renal ROS  negative genitourinary   Musculoskeletal negative musculoskeletal ROS (+)   Abdominal Normal abdominal exam  (+)   Peds negative pediatric ROS (+)  Hematology negative hematology ROS (+)   Anesthesia Other Findings   Reproductive/Obstetrics (+) Pregnancy                             Anesthesia Physical  Anesthesia Plan  ASA: II  Anesthesia Plan: Epidural   Post-op Pain Management:    Induction:   PONV Risk Score and Plan:   Airway Management Planned:   Additional Equipment: None  Intra-op Plan:   Post-operative Plan:   Informed Consent: I have reviewed the patients History and Physical, chart, labs and discussed the procedure including the risks, benefits and alternatives for the proposed anesthesia with the patient or authorized representative who has indicated his/her understanding and acceptance.       Plan Discussed with:   Anesthesia Plan Comments:         Anesthesia Quick Evaluation

## 2019-11-08 LAB — CBC
HCT: 34.7 % — ABNORMAL LOW (ref 36.0–46.0)
Hemoglobin: 11.7 g/dL — ABNORMAL LOW (ref 12.0–15.0)
MCH: 30.7 pg (ref 26.0–34.0)
MCHC: 33.7 g/dL (ref 30.0–36.0)
MCV: 91.1 fL (ref 80.0–100.0)
Platelets: 160 10*3/uL (ref 150–400)
RBC: 3.81 MIL/uL — ABNORMAL LOW (ref 3.87–5.11)
RDW: 14.4 % (ref 11.5–15.5)
WBC: 11.3 10*3/uL — ABNORMAL HIGH (ref 4.0–10.5)
nRBC: 0 % (ref 0.0–0.2)

## 2019-11-08 NOTE — Progress Notes (Addendum)
No c/o; voiding w/o difficulty; normal lochia; baby did have trouble nursing last night, supplementing with formula Says baby to have u/s today  Patient Vitals for the past 24 hrs:  BP Temp Temp src Pulse Resp SpO2  11/08/19 0544 95/68 97.6 F (36.4 C) Oral 76 18 100 %  11/07/19 2355 105/67 98.2 F (36.8 C) Oral 64 18 100 %  11/07/19 2015 107/73 98 F (36.7 C) Oral 64 18 100 %  11/07/19 1603 114/69 98.2 F (36.8 C) Oral 75 16 100 %  11/07/19 1120 99/67 97.9 F (36.6 C) Oral 89 18 100 %  11/07/19 1020 98/66 98.2 F (36.8 C) Oral 73 18 100 %   A&ox3 nml respirations Abd: soft, nt, nd; fundus firm 2cm below umb LE: no edema, nt bilat  CBC Latest Ref Rng & Units 11/08/2019 11/07/2019 05/21/2018  WBC 4.0 - 10.5 K/uL 11.3(H) 7.6 11.5(H)  Hemoglobin 12.0 - 15.0 g/dL 11.7(L) 13.7 13.5  Hematocrit 36 - 46 % 34.7(L) 41.1 41.4  Platelets 150 - 400 K/uL 160 170 252   A/P: ppd1 s/p svd 1. Doing well, contin care 2. gdma1 - fsbs this am 99; recommend diabetic diet, recheck fasting in am 3. Rh pos 4. RI 5. Mild acute anemia d/t blood loss - asymptomatic, iron rich foods

## 2019-11-08 NOTE — Lactation Note (Signed)
This note was copied from a baby's chart. Lactation Consultation Note Baby 82 hrs old. Mom's short shaft nipples are hurting. Mom states she is unable to pump because it hurts to bad. Not latching because of pain. LC hand expressed colostrum mom states pain. Nipples are bruised. Short shaft nipples are compressible. Offered to use NS to protect nipples when latching to help w/pain so she can BF. Mom declined at this time. Asked mom if we can try tomorrow, mom reluctantly said yes. LC told mom goal for tomorrow was to put baby back to breast using NS. Encouraged to use coconut oil, not w/comfort gels. Placed comfort gels on nipples. Mom stated felt good. Gave mom shells, encouraged to wear in am w/bra.  FOB giving formula in bottle. Gave feeding sheet according to hours of age. RN told LC she and another RN has offered to assist mom in BF last night and mom declined.  Mom's 1st child died at 53 days old in NICU. Mom pumped during that time.  Patient Name: Cheryl Barron [id number] Date: 11/08/2019 Reason for consult: Follow-up assessment;Early term 37-38.6wks;Nipple pain/trauma   Maternal Data    Feeding Feeding Type: Bottle Fed - Formula Nipple Type: Slow - flow  LATCH Score       Type of Nipple: Everted at rest and after stimulation (short shaft)  Comfort (Breast/Nipple): Filling, red/small blisters or bruises, mild/mod discomfort (bruising and tenderness)        Interventions Interventions: Breast compression;Shells;Comfort gels;Breast massage;Hand express  Lactation Tools Discussed/Used     Consult Status Consult Status: Follow-up Date: 11/09/19 Follow-up type: In-patient    Rohith Fauth, Diamond Nickel 11/08/2019, 11:10 PM

## 2019-11-08 NOTE — Anesthesia Postprocedure Evaluation (Signed)
Anesthesia Post Note  Patient: Cheryl Barron  Procedure(s) Performed: AN AD HOC LABOR EPIDURAL     Anesthesia Type: Epidural Anesthetic complications: no   No complications documented.  Last Vitals:  Vitals:   11/07/19 2355 11/08/19 0544  BP: 105/67 95/68  Pulse: 64 76  Resp: 18 18  Temp: 36.8 C 36.4 C  SpO2: 100% 100%    Last Pain:  Vitals:   11/08/19 0840  TempSrc:   PainSc: 0-No pain   Pain Goal:                Epidural/Spinal Function Cutaneous sensation: Normal sensation (11/08/19 0840), Patient able to flex knees: Yes (11/08/19 0840), Patient able to lift hips off bed: Yes (11/08/19 0840), Back pain beyond tenderness at insertion site: No (11/08/19 0840), Progressively worsening motor and/or sensory loss: No (11/08/19 0840), Bowel and/or bladder incontinence post epidural: No (11/08/19 0840)  Rica Records

## 2019-11-09 LAB — GLUCOSE, CAPILLARY: Glucose-Capillary: 88 mg/dL (ref 70–99)

## 2019-11-09 MED ORDER — IBUPROFEN 600 MG PO TABS
600.0000 mg | ORAL_TABLET | Freq: Four times a day (QID) | ORAL | 0 refills | Status: DC
Start: 2019-11-09 — End: 2022-07-07

## 2019-11-09 MED ORDER — COCONUT OIL OIL: 1.0000 "application " | TOPICAL_OIL | 0 refills | Status: AC | PRN

## 2019-11-09 MED ORDER — BENZOCAINE-MENTHOL 20-0.5 % EX AERO: 1.0000 "application " | INHALATION_SPRAY | CUTANEOUS | 1 refills | Status: DC | PRN

## 2019-11-09 NOTE — Discharge Instructions (Signed)

## 2019-11-09 NOTE — Lactation Note (Signed)
This note was copied from a baby's chart. Lactation Consultation Note  Patient Name: Cheryl Barron [id number] Date: 11/09/2019 Reason for consult: Follow-up assessment;Nipple pain/trauma;Early term 37-38.6wks;Infant weight loss;Other (Comment);Maternal endocrine disorder (per mom to sore to pump and to sore to latch - see LC note) Type of Endocrine Disorder?: PCOS  Baby is 51 hours old / @ 46 hours - Bili check only 8.2  Per mom confirmed she is to sore to latch and to sore to pump and has only pumped x 1 since it was set up.  LC offered to assess breast tissue and mom receptive.  Mom wearing shells and using coconut oil on her nipples.  Mom showed me the cracking on the top part of her left nipple , no breakdown on either. It appears the cracking is healing.  Per mom will have a DEBP - Spectra at home, and LC recommended to increase her flange to #28 and use the coconut oil until the soreness has improved. Supply and demand reviewed/ and LC stressed the importance of consistent pumping at least 8-12 times a day to establish and protect the milk supply. Sore nipple and engorgement prevention and tx reviewed.  2nd recommendation - when the soreness has improved -  Work on latching , and you may have to give the baby and appetizer of EBM or formula 10 ml so the baby sucking is not overly strong  when the baby latches. Breastfeeding goals per dad is 8-12 times, Bottle feedings at least 8 times a day and at 30 ml and slowly increase.  Storage of breast milk reviewed and mom has the [facility] pamphlet if mom desires to call back for [facility] O/P appt when the soreness has improved.    Maternal Data    Feeding Feeding Type:  (per dad baby recently fed 35 ml of formula / tolerated well) Nipple Type: Slow - flow  LATCH Score                   Interventions Interventions: Breast feeding basics reviewed  Lactation Tools Discussed/Used Tools: Shells;Pump;Flanges Flange Size: 24;27 Shell Type:  Inverted Breast pump type: Double-Electric Breast Pump;Manual Pump Review: Milk Storage   Consult Status Consult Status: Complete Date: 11/09/19    Cheryl Barron 11/09/2019, 11:53 AM

## 2019-11-09 NOTE — Discharge Summary (Signed)
OB Discharge Summary  Patient Name: Cheryl Barron DOB: January 18, 1991 MRN: [id number]  Date of admission: 11/07/2019 Delivering provider: Rhoderick Moody E   Admitting diagnosis: Indication for care in labor or delivery [O75.9] Intrauterine pregnancy: [redacted]w[redacted]d     Secondary diagnosis: Patient Active Problem List   Diagnosis Date Noted  . SVD (spontaneous vaginal delivery) 10/9 11/08/2019  . Second degree perineal laceration 11/08/2019  . Postpartum care following vaginal delivery 10/9 11/08/2019  . Indication for care in labor or delivery 11/07/2019    Date of discharge: 11/09/2019   Discharge diagnosis: Principal Problem:   Postpartum care following vaginal delivery 10/9 Active Problems:   Indication for care in labor or delivery   SVD (spontaneous vaginal delivery) 10/9   Second degree perineal laceration                                                      Post partum procedures:None  Augmentation: AROM Pain control: Epidural  Laceration:2nd degree  Episiotomy:None  Complications: None  Hospital course:  Onset of Labor With Vaginal Delivery      29 y.o. yo G2P1101 at [redacted]w[redacted]d was admitted in Active Labor on 11/07/2019. Patient had an uncomplicated labor course as follows:  Membrane Rupture Time/Date: 5:48 AM ,11/07/2019   Delivery Method:Vaginal, Spontaneous  Episiotomy: None  Lacerations:  2nd degree  Patient had an uncomplicated postpartum course.  She is ambulating, tolerating a regular diet, passing flatus, and urinating well. Patient is discharged home in stable condition on 11/09/19.  Newborn Data: Birth date:11/07/2019  Birth time:8:13 AM  Gender:Female  Living status:Living  Apgars:9 ,9  Weight:2740 g   Physical exam  Vitals:   11/08/19 0544 11/08/19 1453 11/08/19 2022 11/09/19 0535  BP: 95/68 108/75 100/73 109/72  Pulse: 76 86 (!) 107 66  Resp: 18 16 16 18   Temp: 97.6 F (36.4 C) 98.3 F (36.8 C) 97.8 F (36.6 C) 98.1 F (36.7 C)  TempSrc: Oral Oral Oral Oral   SpO2: 100% 99% 100% 100%  Weight:      Height:       General: alert, cooperative and no distress Lochia: appropriate Uterine Fundus: firm Perineum: repair intact, no edema DVT Evaluation: No evidence of DVT seen on physical exam. No significant calf/ankle edema. Labs: Lab Results  Component Value Date   WBC 11.3 (H) 11/08/2019   HGB 11.7 (L) 11/08/2019   HCT 34.7 (L) 11/08/2019   MCV 91.1 11/08/2019   PLT 160 11/08/2019   No flowsheet data found. Edinburgh Postnatal Depression Scale Screening Tool 11/08/2019 11/07/2019 05/23/2018 05/22/2018  I have been able to laugh and see the funny side of things. (No Data) (No Data) 1 (No Data)  I have looked forward with enjoyment to things. - - 0 -  I have blamed myself unnecessarily when things went wrong. - - 0 -  I have been anxious or worried for no good reason. - - 0 -  I have felt scared or panicky for no good reason. - - 0 -  Things have been getting on top of me. - - 0 -  I have been so unhappy that I have had difficulty sleeping. - - 1 -  I have felt sad or miserable. - - 0 -  I have been so unhappy that I have been crying. - - 0 -  The thought of harming myself has occurred to me. - - 0 -  Edinburgh Postnatal Depression Scale Total - - 2 -   Vaccines: TDaP UTD         Flu    At discharge         COVID-19   ?  Discharge instructions:  per After Visit Summary and [facility] OB booklet  After Visit Meds:  Allergies as of 11/09/2019   No Known Allergies     Medication List    STOP taking these medications   acetaminophen 325 MG tablet Commonly known as: Tylenol     TAKE these medications   benzocaine-Menthol 20-0.5 % Aero Commonly known as: DERMOPLAST Apply 1 application topically as needed for irritation (perineal discomfort).   coconut oil Oil Apply 1 application topically as needed.   ibuprofen 600 MG tablet Commonly known as: ADVIL Take 1 tablet (600 mg total) by mouth every 6 (six) hours.   PRENATAL  VITAMIN PO Take by mouth.            Discharge Care Instructions  (From admission, onward)         Start     Ordered   11/09/19 0000  Discharge wound care:       Comments: Sitz baths 2 times /day with warm water x 1 week. May add herbals: 1 ounce dried comfrey leaf* 1 ounce calendula flowers 1 ounce lavender flowers  Supplies can be found online at [employer] sources at [employer], [employer]  1/2 ounce dried uva ursi leaves 1/2 ounce witch hazel blossoms (if you can find them) 1/2 ounce dried sage leaf 1/2 cup sea salt Directions: Bring 2 quarts of water to a boil. Turn off heat, and place 1 ounce (approximately 1 large handful) of the above mixed herbs (not the salt) into the pot. Steep, covered, for 30 minutes.  Strain the liquid well with a fine mesh strainer, and discard the herb material. Add 2 quarts of liquid to the tub, along with the 1/2 cup of salt. This medicinal liquid can also be made into compresses and peri-rinses.   11/09/19 1002         Diet: routine diet  Activity: Advance as tolerated. Pelvic rest for 6 weeks.   Newborn Data: Live born female  Birth Weight: 6 lb 0.7 oz (2740 g) APGAR: 9, 9  Newborn Delivery   Birth date/time: 11/07/2019 08:13:00 Delivery type: Vaginal, Spontaneous     Named Evan Baby Feeding: Bottle and Breast Disposition:home with mother  Delivery Report:  Review the Delivery Report for details.    Follow up:  Follow-up Information    Noland Fordyce, MD. Schedule an appointment as soon as possible for a visit in 6 week(s).   Specialty: Obstetrics and Gynecology Why: Please make an appointment for 6 weeks postpartum. Contact information: [address] [place] [address] [phone]               June Leap, CNM, MSN 11/09/2019, 10:03 AM

## 2021-12-27 LAB — OB RESULTS CONSOLE RPR: RPR: NONREACTIVE

## 2021-12-27 LAB — OB RESULTS CONSOLE GC/CHLAMYDIA
Chlamydia: NEGATIVE
Neisseria Gonorrhea: NEGATIVE

## 2021-12-27 LAB — OB RESULTS CONSOLE RUBELLA ANTIBODY, IGM: Rubella: IMMUNE

## 2021-12-27 LAB — OB RESULTS CONSOLE HIV ANTIBODY (ROUTINE TESTING): HIV: NONREACTIVE

## 2021-12-27 LAB — OB RESULTS CONSOLE HEPATITIS B SURFACE ANTIGEN: Hepatitis B Surface Ag: NEGATIVE

## 2022-01-29 NOTE — L&D Delivery Note (Addendum)
   Delivery Note:   G3P1101 at [redacted]w[redacted]d  Admitting diagnosis: Normal labor [O80, Z37.9] Risks: A1GDM Onset of labor: 07/08/2022 at 2300  IOL/Augmentation: AROM ROM: 07/09/2022 at 0800  Complete dilation at 07/09/2022  1031 Onset of pushing at 1031 FHR second stage Cat I  Analgesia/Anesthesia intrapartum:Epidural  Pushing in lithotomy position with CNM and L&D staff support. Husband, Cheryl Barron, and sister present for birth and supportive.  Delivery of a Live born female  Birth Weight:  pending APGAR: 9, 9  Newborn Delivery   Birth date/time: 07/09/2022 10:39:00 Delivery type: Vaginal, Spontaneous     in cephalic presentation, position OA to ROA.  APGAR:1 min-9 , 5 min-9   Nuchal Cord: No  Cord double clamped after cessation of pulsation, cut by Cheryl Barron.  Collection of cord blood for typing completed. Arterial cord blood sample-No    Placenta delivered-Spontaneous  with 3 vessels . Uterotonics: Pitocin Placenta to L&D Uterine tone firm  Bleeding small  1st degree  laceration identified.  Episiotomy:None  Local analgesia: N/A  Repair: 3-0 in usual fashion with excellent hemostasis Est. Blood Loss (mL):204.00   Complications: None  Mom to postpartum. Baby Cheryl Barron to Couplet care / Skin to Skin.  Delivery Report:   Review the Delivery Report for details.    June Leap, CNM, MSN 07/09/2022, 11:04 AM

## 2022-05-14 LAB — HEPATITIS C ANTIBODY: HCV Ab: NEGATIVE

## 2022-06-18 LAB — OB RESULTS CONSOLE GBS: GBS: NEGATIVE

## 2022-06-29 ENCOUNTER — Other Ambulatory Visit: Payer: Self-pay

## 2022-06-29 ENCOUNTER — Encounter (HOSPITAL_COMMUNITY): Payer: Self-pay | Admitting: Obstetrics and Gynecology

## 2022-06-29 ENCOUNTER — Inpatient Hospital Stay (HOSPITAL_COMMUNITY)
Admission: AD | Admit: 2022-06-29 | Discharge: 2022-06-29 | Disposition: A | Discharge status: Home / Self Care | Payer: 59 | Attending: Obstetrics and Gynecology | Admitting: Obstetrics and Gynecology

## 2022-06-29 DIAGNOSIS — Z3A37 37 weeks gestation of pregnancy: Secondary | ICD-10-CM

## 2022-06-29 DIAGNOSIS — O471 False labor at or after 37 completed weeks of gestation: Secondary | ICD-10-CM | POA: Diagnosis present

## 2022-06-29 NOTE — MAU Note (Signed)
Cheryl Barron is a 32 y.o. at [redacted]w[redacted]d here in MAU reporting: she was instructed by Dr. Juliene Pina to be seen in MAU for cervical exam secondary spotting this morning.  Reports no longer spotting and having irregular Braxton Hicks ctxs.  Denies VB or LOF.  Reports +FM. LMP: NA Onset of complaint: today Pain score: 0 Vitals:   06/29/22 1337  BP: (!) 101/58  Pulse: 85  Resp: 18  Temp: 98.3 F (36.8 C)  SpO2: 97%     FHT:128 bpm Lab orders placed from triage:   None

## 2022-06-29 NOTE — Discharge Instructions (Signed)

## 2022-06-29 NOTE — MAU Provider Note (Signed)
S: Ms. Cheryl Barron is a 32 y.o. G3P1101 at [redacted]w[redacted]d  who presents to MAU today stating she was sent from the office for a cervical exam. She saw some spotting this am and was told to be checked. She is no longer having spotting. She denies any pain or contractions. She denies LOF. She reports normal fetal movement.    O: BP 107/74   Pulse 82   Temp 98.3 F (36.8 C) (Oral)   Resp 18   Ht (!) 5" (0.127 m)   Wt 51.1 kg   SpO2 97%   BMI 3169.47 kg/m  GENERAL: Well-developed, well-nourished female in no acute distress.  HEAD: Normocephalic, atraumatic.  CHEST: Normal effort of breathing, regular heart rate ABDOMEN: Soft, nontender, gravid  Cervical exam:  Dilation: 4 Effacement (%): 50 Cervical Position: Posterior Station: -2 Exam by:: Zenia Resides, RN   Fetal Monitoring: Baseline: 120 Variability: moderate Accelerations: 15x15 Decelerations: none Contractions: 1 UC   A: SIUP at [redacted]w[redacted]d  False labor  P: -Discharge home in stable condition -Labor precautions discussed -Patient advised to follow-up with OB as scheduled for prenatal care -Patient may return to MAU as needed or if her condition were to change or worsen   Rolm Bookbinder, [place] 06/29/2022 2:25 PM

## 2022-07-06 ENCOUNTER — Inpatient Hospital Stay (EMERGENCY_DEPARTMENT_HOSPITAL)
Admission: AD | Admit: 2022-07-06 | Discharge: 2022-07-07 | Disposition: A | Discharge status: Home / Self Care | Payer: 59 | Source: Home / Self Care | Attending: Obstetrics and Gynecology | Admitting: Obstetrics and Gynecology

## 2022-07-06 DIAGNOSIS — Z3689 Encounter for other specified antenatal screening: Secondary | ICD-10-CM

## 2022-07-06 DIAGNOSIS — Z3A38 38 weeks gestation of pregnancy: Secondary | ICD-10-CM | POA: Insufficient documentation

## 2022-07-06 DIAGNOSIS — O368130 Decreased fetal movements, third trimester, not applicable or unspecified: Secondary | ICD-10-CM | POA: Insufficient documentation

## 2022-07-06 NOTE — MAU Note (Signed)
Pt says decrease Fetal movement - last movement was 30 min ago. In office today - VE - 5 cm- Dr Alycia Patten some UC's Denies HSV GBS- neg

## 2022-07-07 ENCOUNTER — Encounter (HOSPITAL_COMMUNITY): Payer: Self-pay | Admitting: Obstetrics and Gynecology

## 2022-07-07 ENCOUNTER — Inpatient Hospital Stay (HOSPITAL_BASED_OUTPATIENT_CLINIC_OR_DEPARTMENT_OTHER): Payer: 59

## 2022-07-07 DIAGNOSIS — O368130 Decreased fetal movements, third trimester, not applicable or unspecified: Secondary | ICD-10-CM

## 2022-07-07 DIAGNOSIS — O24420 Gestational diabetes mellitus in childbirth, diet controlled: Secondary | ICD-10-CM | POA: Diagnosis not present

## 2022-07-07 DIAGNOSIS — Z3A38 38 weeks gestation of pregnancy: Secondary | ICD-10-CM | POA: Diagnosis not present

## 2022-07-07 DIAGNOSIS — Z3689 Encounter for other specified antenatal screening: Secondary | ICD-10-CM

## 2022-07-07 DIAGNOSIS — O09213 Supervision of pregnancy with history of pre-term labor, third trimester: Secondary | ICD-10-CM | POA: Diagnosis not present

## 2022-07-07 DIAGNOSIS — O26893 Other specified pregnancy related conditions, third trimester: Secondary | ICD-10-CM | POA: Diagnosis not present

## 2022-07-07 NOTE — MAU Provider Note (Signed)
History     CSN: [id number]  Arrival date and time: 07/06/22 2339   Event Date/Time   First Provider Initiated Contact with Patient 07/07/22 0012      Chief Complaint  Patient presents with   Decreased Fetal Movement   Contractions   HPI Cheryl Barron is a 32 y.o. G3P1101 at [redacted]w[redacted]d who presents to MAU with chief complaint of decreased fetal movement. She last felt movement about 30 minutes prior to her arrival in MAU. She endorses small but detectable movements with application of EFM.   Patient reports contractions to MAU registration. She denies contractions on CNM initial assessment but reports recurrent history of brief spurts of intense contractions which spontaneously resolve. She denies vaginal bleeding, leaking of fluid, fever, falls, or recent illness.   Patient receives care with [facility]. Her next appointment is Monday 07/09/2022.  OB History     Gravida  3   Para  2   Term  1   Preterm  1   AB      Living  1      SAB      IAB      Ectopic      Multiple  0   Live Births  2           Past Medical History:  Diagnosis Date   Female infertility    GERD (gastroesophageal reflux disease)    Gestational diabetes    PCOS (polycystic ovarian syndrome)     Past Surgical History:  Procedure Laterality Date   NO PAST SURGERIES      History reviewed. No pertinent family history.  Social History   Tobacco Use   Smoking status: Never   Smokeless tobacco: Never  Vaping Use   Vaping Use: Never used  Substance Use Topics   Alcohol use: Never   Drug use: Never    Allergies: No Known Allergies  Medications Prior to Admission  Medication Sig Dispense Refill Last Dose   Prenatal Vit-Fe Fumarate-FA (PRENATAL VITAMIN PO) Take 1 capsule by mouth daily.    07/06/2022   benzocaine-Menthol (DERMOPLAST) 20-0.5 % AERO Apply 1 application topically as needed for irritation (perineal discomfort). 56 g 1    coconut oil OIL Apply 1 application topically  as needed.  0    ibuprofen (ADVIL) 600 MG tablet Take 1 tablet (600 mg total) by mouth every 6 (six) hours. 30 tablet 0     Review of Systems  All other systems reviewed and are negative.  Physical Exam   Blood pressure 109/62, pulse 72, temperature 97.9 F (36.6 C), temperature source Oral, resp. rate 16, height 5' (1.524 m), weight 51.1 kg, unknown if currently breastfeeding.  Physical Exam Vitals and nursing note reviewed. Exam conducted with a chaperone present.  Constitutional:      Appearance: Normal appearance. She is not ill-appearing.  Cardiovascular:     Rate and Rhythm: Normal rate.  Pulmonary:     Effort: Pulmonary effort is normal.  Abdominal:     Comments: Gravid  Skin:    Capillary Refill: Capillary refill takes less than 2 seconds.  Neurological:     Mental Status: She is alert and oriented to person, place, and time.  Psychiatric:        Mood and Affect: Mood normal.        Behavior: Behavior normal.        Thought Content: Thought content normal.        Judgment: Judgment normal.  MAU Course  Procedures  MDM  --Reactive tracing x almost 1 hour continuous monitoring.  --Baseline 130, mod var, + 15 x 15 accels, no decels --Toco: occasional irregular contractions --Cervix 4/70/-2 per my exam, stretchy to 5cm  Orders Placed This Encounter  Procedures   [place] MFM Fetal BPP Wo Non Stress   Discharge patient   Patient Vitals for the past 24 hrs:  BP Temp Temp src Pulse Resp Height Weight  07/07/22 0132 116/70 -- -- 69 -- -- --  07/07/22 0009 109/62 -- -- 72 -- -- --  07/06/22 2355 113/72 97.9 F (36.6 C) Oral 80 16 5' (1.524 m) 51.1 kg   Assessment and Plan  --32 y.o. G3P1101 at [redacted]w[redacted]d  --Reactive tracing --BPP 8/8 --Cervix unchanged from previous --Predominantly quiet toco --Patient endorsing fetal movement throughout MAU encounter --Discharge home in stable condition with labor precautions  Calvert Cantor, MSA, MSN, CNM 07/07/2022, 3:38  AM

## 2022-07-09 ENCOUNTER — Inpatient Hospital Stay (HOSPITAL_COMMUNITY)
Admission: AD | Admit: 2022-07-09 | Discharge: 2022-07-10 | DRG: 807 | Disposition: A | Discharge status: Home / Self Care | Payer: 59 | Attending: Obstetrics and Gynecology | Admitting: Obstetrics and Gynecology

## 2022-07-09 ENCOUNTER — Inpatient Hospital Stay (HOSPITAL_COMMUNITY): Payer: 59 | Admitting: Anesthesiology

## 2022-07-09 ENCOUNTER — Encounter (HOSPITAL_COMMUNITY): Payer: Self-pay | Admitting: Obstetrics and Gynecology

## 2022-07-09 ENCOUNTER — Other Ambulatory Visit: Payer: Self-pay

## 2022-07-09 DIAGNOSIS — Z3A38 38 weeks gestation of pregnancy: Secondary | ICD-10-CM | POA: Diagnosis not present

## 2022-07-09 DIAGNOSIS — O24420 Gestational diabetes mellitus in childbirth, diet controlled: Principal | ICD-10-CM | POA: Diagnosis present

## 2022-07-09 DIAGNOSIS — O368130 Decreased fetal movements, third trimester, not applicable or unspecified: Secondary | ICD-10-CM | POA: Diagnosis present

## 2022-07-09 DIAGNOSIS — O24410 Gestational diabetes mellitus in pregnancy, diet controlled: Secondary | ICD-10-CM | POA: Diagnosis present

## 2022-07-09 DIAGNOSIS — O26893 Other specified pregnancy related conditions, third trimester: Secondary | ICD-10-CM | POA: Diagnosis present

## 2022-07-09 LAB — CBC WITH DIFFERENTIAL/PLATELET
Abs Immature Granulocytes: 0.02 10*3/uL (ref 0.00–0.07)
Basophils Absolute: 0 10*3/uL (ref 0.0–0.1)
Basophils Relative: 0 %
Eosinophils Absolute: 0.1 10*3/uL (ref 0.0–0.5)
Eosinophils Relative: 1 %
HCT: 37.7 % (ref 36.0–46.0)
Hemoglobin: 12.7 g/dL (ref 12.0–15.0)
Immature Granulocytes: 0 %
Lymphocytes Relative: 37 %
Lymphs Abs: 2.2 10*3/uL (ref 0.7–4.0)
MCH: 31.4 pg (ref 26.0–34.0)
MCHC: 33.7 g/dL (ref 30.0–36.0)
MCV: 93.1 fL (ref 80.0–100.0)
Monocytes Absolute: 0.7 10*3/uL (ref 0.1–1.0)
Monocytes Relative: 12 %
Neutro Abs: 3 10*3/uL (ref 1.7–7.7)
Neutrophils Relative %: 50 %
Platelets: 132 10*3/uL — ABNORMAL LOW (ref 150–400)
RBC: 4.05 MIL/uL (ref 3.87–5.11)
RDW: 14.3 % (ref 11.5–15.5)
WBC: 6 10*3/uL (ref 4.0–10.5)
nRBC: 0 % (ref 0.0–0.2)

## 2022-07-09 LAB — TYPE AND SCREEN
ABO/RH(D): A POS
Antibody Screen: NEGATIVE

## 2022-07-09 LAB — CBC
HCT: 43 % (ref 36.0–46.0)
Hemoglobin: 13.6 g/dL (ref 12.0–15.0)
MCH: 31.3 pg (ref 26.0–34.0)
MCHC: 31.6 g/dL (ref 30.0–36.0)
MCV: 98.9 fL (ref 80.0–100.0)
Platelets: 74 10*3/uL — ABNORMAL LOW (ref 150–400)
RBC: 4.35 MIL/uL (ref 3.87–5.11)
RDW: 14.4 % (ref 11.5–15.5)
WBC: 5.3 10*3/uL (ref 4.0–10.5)
nRBC: 0 % (ref 0.0–0.2)

## 2022-07-09 LAB — RPR: RPR Ser Ql: NONREACTIVE

## 2022-07-09 MED ORDER — SOD CITRATE-CITRIC ACID 500-334 MG/5ML PO SOLN: 30.0000 mL | ORAL | Status: DC | PRN

## 2022-07-09 MED ORDER — LIDOCAINE HCL (PF) 1 % IJ SOLN: 30.0000 mL | INTRAMUSCULAR | Status: DC | PRN

## 2022-07-09 MED ORDER — OXYTOCIN BOLUS FROM INFUSION
333.0000 mL | Freq: Once | INTRAVENOUS | Status: AC
  Administered 2022-07-09: 333 mL via INTRAVENOUS

## 2022-07-09 MED ORDER — ONDANSETRON HCL 4 MG/2ML IJ SOLN: 4.0000 mg | INTRAMUSCULAR | Status: DC | PRN

## 2022-07-09 MED ORDER — SODIUM CHLORIDE 0.9% FLUSH: 3.0000 mL | INTRAVENOUS | Status: DC | PRN

## 2022-07-09 MED ORDER — LACTATED RINGERS IV SOLN: 500.0000 mL | INTRAVENOUS | Status: DC | PRN

## 2022-07-09 MED ORDER — WITCH HAZEL-GLYCERIN EX PADS
1.0000 | MEDICATED_PAD | CUTANEOUS | Status: DC | PRN
  Administered 2022-07-09: 1 via TOPICAL

## 2022-07-09 MED ORDER — DIPHENHYDRAMINE HCL 25 MG PO CAPS: 25.0000 mg | ORAL_CAPSULE | Freq: Four times a day (QID) | ORAL | Status: DC | PRN

## 2022-07-09 MED ORDER — FENTANYL CITRATE (PF) 100 MCG/2ML IJ SOLN: 50.0000 ug | INTRAMUSCULAR | Status: DC | PRN

## 2022-07-09 MED ORDER — ACETAMINOPHEN 500 MG PO TABS: 1000.0000 mg | ORAL_TABLET | Freq: Four times a day (QID) | ORAL | Status: DC | PRN

## 2022-07-09 MED ORDER — LACTATED RINGERS IV SOLN: 500.0000 mL | Freq: Once | INTRAVENOUS | Status: DC

## 2022-07-09 MED ORDER — TETANUS-DIPHTH-ACELL PERTUSSIS 5-2.5-18.5 LF-MCG/0.5 IM SUSY: 0.5000 mL | PREFILLED_SYRINGE | Freq: Once | INTRAMUSCULAR | Status: DC

## 2022-07-09 MED ORDER — SODIUM CHLORIDE 0.9 % IV SOLN: INTRAVENOUS | Status: DC | PRN

## 2022-07-09 MED ORDER — BENZOCAINE-MENTHOL 20-0.5 % EX AERO
1.0000 | INHALATION_SPRAY | CUTANEOUS | Status: DC | PRN
  Administered 2022-07-09: 1 via TOPICAL
  Filled 2022-07-09: qty 56

## 2022-07-09 MED ORDER — LIDOCAINE HCL (PF) 1 % IJ SOLN
INTRAMUSCULAR | Status: DC | PRN
  Administered 2022-07-09: 5 mL via EPIDURAL
  Administered 2022-07-09: 2 mL via EPIDURAL

## 2022-07-09 MED ORDER — DIPHENHYDRAMINE HCL 50 MG/ML IJ SOLN: 12.5000 mg | INTRAMUSCULAR | Status: DC | PRN

## 2022-07-09 MED ORDER — LACTATED RINGERS IV SOLN: INTRAVENOUS | Status: DC

## 2022-07-09 MED ORDER — EPHEDRINE 5 MG/ML INJ: 10.0000 mg | INTRAVENOUS | Status: DC | PRN

## 2022-07-09 MED ORDER — OXYTOCIN 10 UNIT/ML IJ SOLN: 10.0000 [IU] | Freq: Once | INTRAMUSCULAR | Status: DC

## 2022-07-09 MED ORDER — FENTANYL-BUPIVACAINE-NACL 0.5-0.125-0.9 MG/250ML-% EP SOLN
12.0000 mL/h | EPIDURAL | Status: DC | PRN
  Administered 2022-07-09: 12 mL/h via EPIDURAL
  Filled 2022-07-09: qty 250

## 2022-07-09 MED ORDER — ONDANSETRON HCL 4 MG/2ML IJ SOLN: 4.0000 mg | Freq: Four times a day (QID) | INTRAMUSCULAR | Status: DC | PRN

## 2022-07-09 MED ORDER — ZOLPIDEM TARTRATE 5 MG PO TABS: 5.0000 mg | ORAL_TABLET | Freq: Every evening | ORAL | Status: DC | PRN

## 2022-07-09 MED ORDER — DIBUCAINE (PERIANAL) 1 % EX OINT: 1.0000 | TOPICAL_OINTMENT | CUTANEOUS | Status: DC | PRN

## 2022-07-09 MED ORDER — ACETAMINOPHEN 325 MG PO TABS
650.0000 mg | ORAL_TABLET | ORAL | Status: DC | PRN
  Administered 2022-07-09: 650 mg via ORAL
  Filled 2022-07-09: qty 2

## 2022-07-09 MED ORDER — SENNOSIDES-DOCUSATE SODIUM 8.6-50 MG PO TABS
2.0000 | ORAL_TABLET | Freq: Every day | ORAL | Status: DC
  Administered 2022-07-10: 2 via ORAL
  Filled 2022-07-09: qty 2

## 2022-07-09 MED ORDER — PHENYLEPHRINE 80 MCG/ML (10ML) SYRINGE FOR IV PUSH (FOR BLOOD PRESSURE SUPPORT): 80.0000 ug | PREFILLED_SYRINGE | INTRAVENOUS | Status: DC | PRN

## 2022-07-09 MED ORDER — SIMETHICONE 80 MG PO CHEW: 80.0000 mg | CHEWABLE_TABLET | ORAL | Status: DC | PRN

## 2022-07-09 MED ORDER — SODIUM CHLORIDE 0.9% FLUSH: 3.0000 mL | Freq: Two times a day (BID) | INTRAVENOUS | Status: DC

## 2022-07-09 MED ORDER — ONDANSETRON HCL 4 MG PO TABS: 4.0000 mg | ORAL_TABLET | ORAL | Status: DC | PRN

## 2022-07-09 MED ORDER — PRENATAL MULTIVITAMIN CH
1.0000 | ORAL_TABLET | Freq: Every day | ORAL | Status: DC
  Administered 2022-07-10: 1 via ORAL
  Filled 2022-07-09: qty 1

## 2022-07-09 MED ORDER — COCONUT OIL OIL
1.0000 | TOPICAL_OIL | Status: DC | PRN
  Administered 2022-07-10: 1 via TOPICAL

## 2022-07-09 MED ORDER — OXYTOCIN-SODIUM CHLORIDE 30-0.9 UT/500ML-% IV SOLN
2.5000 [IU]/h | INTRAVENOUS | Status: DC
  Filled 2022-07-09: qty 500

## 2022-07-09 MED ORDER — IBUPROFEN 600 MG PO TABS
600.0000 mg | ORAL_TABLET | Freq: Four times a day (QID) | ORAL | Status: DC
  Administered 2022-07-09 – 2022-07-10 (×5): 600 mg via ORAL
  Filled 2022-07-09 (×5): qty 1

## 2022-07-09 NOTE — Anesthesia Procedure Notes (Signed)
Epidural Patient location during procedure: OB Start time: 07/09/2022 7:07 AM End time: 07/09/2022 7:14 AM  Staffing Anesthesiologist: Marcene Duos, MD Performed: anesthesiologist   Preanesthetic Checklist Completed: patient identified, IV checked, site marked, risks and benefits discussed, surgical consent, monitors and equipment checked, pre-op evaluation and timeout performed  Epidural Patient position: sitting Prep: DuraPrep and site prepped and draped Patient monitoring: continuous pulse ox and blood pressure Approach: midline Location: L4-L5 Injection technique: LOR air  Needle:  Needle type: Tuohy  Needle gauge: 17 G Needle length: 9 cm and 9 Needle insertion depth: 4 cm Catheter type: closed end flexible Catheter size: 19 Gauge Catheter at skin depth: 9 cm Test dose: negative  Assessment Events: blood not aspirated, no cerebrospinal fluid, injection not painful, no injection resistance, no paresthesia and negative IV test

## 2022-07-09 NOTE — Anesthesia Preprocedure Evaluation (Signed)
Anesthesia Evaluation  Patient identified by MRN, date of birth, ID band Patient awake    Reviewed: Allergy & Precautions, NPO status , Patient's Chart, lab work & pertinent test results  Airway Mallampati: II  TM Distance: >3 FB     Dental   Pulmonary neg pulmonary ROS   Pulmonary exam normal        Cardiovascular negative cardio ROS Normal cardiovascular exam     Neuro/Psych negative neurological ROS     GI/Hepatic Neg liver ROS,GERD  ,,  Endo/Other  diabetes, Gestational    Renal/GU negative Renal ROS     Musculoskeletal   Abdominal   Peds  Hematology  (+) Blood dyscrasia (plts 132)   Anesthesia Other Findings   Reproductive/Obstetrics (+) Pregnancy                              Lab Results  Component Value Date   WBC 6.0 07/09/2022   HGB 12.7 07/09/2022   HCT 37.7 07/09/2022   MCV 93.1 07/09/2022   PLT 132 (L) 07/09/2022    Anesthesia Physical Anesthesia Plan  ASA: 2  Anesthesia Plan: Epidural   Post-op Pain Management:    Induction:   PONV Risk Score and Plan: 2 and Treatment may vary due to age or medical condition  Airway Management Planned: Natural Airway  Additional Equipment:   Intra-op Plan:   Post-operative Plan:   Informed Consent: I have reviewed the patients History and Physical, chart, labs and discussed the procedure including the risks, benefits and alternatives for the proposed anesthesia with the patient or authorized representative who has indicated his/her understanding and acceptance.       Plan Discussed with:   Anesthesia Plan Comments:          Anesthesia Quick Evaluation

## 2022-07-09 NOTE — MAU Note (Signed)
.  Cheryl Barron is a 32 y.o. at [redacted]w[redacted]d here in MAU reporting:   Contractions every: 2 minutes Onset of ctx: Yesterday 2300 Pain score: 7/10  ROM: Intact Vaginal Bleeding: None Last SVE: 4cm   Epidural: Undecided   Fetal Movement: Reports positive FM FHT:135  via External  Vitals:   07/09/22 0443  BP: 126/79  Pulse: 65  Resp: 16  Temp: 97.6 F (36.4 C)  SpO2: 98%      GDM Diet Controlled OB Office: [facility] GBS: Negative HSV: Denies hx of HSV Lab orders placed from triage: MAU Labor Eval

## 2022-07-09 NOTE — Progress Notes (Signed)
S: Comfortable with epidural. Discussed the R/B/A of AROM for labor augmentation and patient consents to procedure. Husband and sister present and supportive.   O: Vitals:   07/09/22 0745 07/09/22 0750 07/09/22 0755 07/09/22 0800  BP: 128/60   109/73  Pulse: 71 70 85 93  Resp: 18 18 18 18   Temp:      TempSrc:      SpO2: 100% 99% 100% 99%   FHT:  FHR: 135 bpm, variability: moderate,  accelerations:  Present,  decelerations:  Absent UC:   regular, every 1.5-3 minutes SVE:   Dilation: 8 Effacement (%): 100 Station: 0 Exam by:: Dorisann Frames, CNM  AROM of a moderate amount of clear fluid at 0800.   A / P: Spontaneous labor, progressing normally  Fetal Wellbeing:  Category I GBS: Negative Pain Control:  Epidural Anticipated MOD:  NSVD  Cheryl Barron, CNM, MSN 07/09/2022, 8:04 AM

## 2022-07-09 NOTE — H&P (Signed)
   OB ADMISSION/ HISTORY & PHYSICAL:  Admission Date: 07/09/2022  4:26 AM  Admit Diagnosis: Normal labor [O80, Z37.9]    Cheryl Barron is a 32 y.o. female G3P1101 at [redacted]w[redacted]d presenting for labor. Patient states contractions started at 2300 and are now 2-4 minutes apart. Per patient, last VE in office 4cm. Denies leaking of fluid or vaginal bleeding. Endorses + fetal movement. Planning unmedicated birth. Eagerly anticipating a surprise baby.   Prenatal History: G3P1101   EDC: 07/18/2022 Prenatal care at [facility] since 11 weeks  Primary: Dr. 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, excellent CBG control  Prenatal Labs: ABO, Rh:   A POS Antibody: PENDING (06/10 0527) Negative Rubella:   Immune RPR:   Non-reactive HBsAg:   Negative HIV:   Negative GBS:   Negative 1 hr Glucola : 179, failed 3hr gtt, diagnosed A1GDM Genetic Screening: low risk Panorama Ultrasound: normal anatomy, posterior placenta, AGA, last growth at 36 weeks 31%    Maternal Diabetes: Yes:  Diabetes Type:  Diet controlled Genetic Screening: Normal Maternal Ultrasounds/Referrals: Normal Fetal Ultrasounds or other Referrals:  None Maternal Substance Abuse:  No Significant Maternal Medications:  None Significant Maternal Lab Results:  Group B Strep negative Other Comments:  None  Medical / Surgical History : Past medical history:  Past Medical History:  Diagnosis Date   Female infertility    GERD (gastroesophageal reflux disease)    Gestational diabetes    PCOS (polycystic ovarian syndrome)     Past surgical history:  Past Surgical History:  Procedure Laterality Date   NO PAST SURGERIES      Family History: History reviewed. No pertinent family history.  Social History:  reports that she has never smoked. She has never used smokeless tobacco. She reports that she does not drink alcohol and does not use  drugs.  Allergies: Patient has no known allergies.   Current Medications at time of admission:  Medications Prior to Admission  Medication Sig Dispense Refill Last Dose   Prenatal Vit-Fe Fumarate-FA (PRENATAL VITAMIN PO) Take 1 capsule by mouth daily.    07/09/2022   coconut oil OIL Apply 1 application topically as needed.  0     Review of Systems: Review of Systems  All other systems reviewed and are negative.  Physical Exam: Vital signs and nursing notes reviewed.  Patient Vitals for the past 24 hrs:  BP Temp Temp src Pulse Resp SpO2  07/09/22 0443 126/79 97.6 F (36.4 C) Oral 65 16 98 %    General: AAO x 3, NAD Heart: RRR Lungs:CTAB Abdomen: Gravid, NT Extremities: no edema SVE: Dilation: 4.5 Effacement (%): 90 Exam by:: Dorisann Frames, CNM   FHR: 125BPM, moderate variability, + accels, no decels TOCO: Contractions q 1-3 minutes  Labs:   No results for input(s): "WBC", "HGB", "HCT", "PLT" in the last 72 hours.  Assessment/Plan: 32 y.o. G3P1101 at [redacted]w[redacted]d, labor, history of precipitous labor with G2 A1GDM, excellent CBG control, AGA at 36 weeks, 31%  Fetal wellbeing - FHT category 1 EFW AGA 6-7lbs  Labor: Active labor, plan AROM prn  GBS negative Rubella immune Rh positive  Pain control: desires unmedicated labor, open to epidural Analgesia/anesthesia PRN  Anticipated MOD: NSVB  Plans to breastfeed, declines circumcision. POC discussed with patient and support team, all questions answered.  Dr. Billy Coast notified of admission/plan of care.  June Leap CNM, MSN 07/09/2022, 5:40 AM

## 2022-07-09 NOTE — Lactation Note (Signed)
This note was copied from a baby's chart. Lactation Consultation Note  Patient Name: Cheryl Barron [id number] Date: 07/09/2022 Age:33 hours Reason for consult: Initial assessment;Early term 37-38.6wks;Maternal endocrine disorder Nurse assisted baby on the breast d/t baby very sleepy. Baby had great latch and BF well.  Newborn feeding habits, behavior, STS, I&O, support, supply and demand reviewed. Mom encouraged to feed baby 8-12 times/24 hours and with feeding cues.  Discussed body alignment of baby during BF. Praised mom for good latch. Mom denies painful latch. Mom stated she has colostrum.  Encouraged to call for assistance as needed.  Maternal Data Does the patient have breastfeeding experience prior to this delivery?: Yes How long did the patient breastfeed?: 1st child-21 days (baby passed away) 2nd child 6 months  Feeding    LATCH Score Latch: Grasps breast easily, tongue down, lips flanged, rhythmical sucking.  Audible Swallowing: Spontaneous and intermittent  Type of Nipple: Everted at rest and after stimulation  Comfort (Breast/Nipple): Soft / non-tender  Hold (Positioning): Assistance needed to correctly position infant at breast and maintain latch.  LATCH Score: 9   Lactation Tools Discussed/Used    Interventions Interventions: Breast feeding basics reviewed;Support pillows;Skin to skin;LC Services brochure;Breast compression  Discharge    Consult Status Consult Status: Follow-up Date: 07/10/22 Follow-up type: In-patient    [name] 07/09/2022, 8:54 PM

## 2022-07-10 LAB — CBC
HCT: 32.3 % — ABNORMAL LOW (ref 36.0–46.0)
Hemoglobin: 11 g/dL — ABNORMAL LOW (ref 12.0–15.0)
MCH: 31.7 pg (ref 26.0–34.0)
MCHC: 34.1 g/dL (ref 30.0–36.0)
MCV: 93.1 fL (ref 80.0–100.0)
Platelets: 134 10*3/uL — ABNORMAL LOW (ref 150–400)
RBC: 3.47 MIL/uL — ABNORMAL LOW (ref 3.87–5.11)
RDW: 14.6 % (ref 11.5–15.5)
WBC: 9.1 10*3/uL (ref 4.0–10.5)
nRBC: 0 % (ref 0.0–0.2)

## 2022-07-10 MED ORDER — IBUPROFEN 600 MG PO TABS: 600.0000 mg | ORAL_TABLET | Freq: Four times a day (QID) | ORAL | 0 refills | Status: AC

## 2022-07-10 MED ORDER — ACETAMINOPHEN 325 MG PO TABS: 650.0000 mg | ORAL_TABLET | ORAL | Status: AC | PRN

## 2022-07-10 NOTE — Anesthesia Postprocedure Evaluation (Signed)
Anesthesia Post Note  Patient: Cheryl Barron  Procedure(s) Performed: AN AD HOC LABOR EPIDURAL     Patient location during evaluation: Mother Baby Anesthesia Type: Epidural Level of consciousness: awake and alert Pain management: pain level controlled Vital Signs Assessment: post-procedure vital signs reviewed and stable Respiratory status: spontaneous breathing, nonlabored ventilation and respiratory function stable Cardiovascular status: stable Postop Assessment: no headache, no backache and epidural receding Anesthetic complications: no   No notable events documented.  Last Vitals:  Vitals:   07/10/22 0135 07/10/22 0522  BP: 90/60 (!) 110/57  Pulse: 74 72  Resp: 16 18  Temp: 36.6 C 36.6 C  SpO2: 100% 100%    Last Pain:  Vitals:   07/10/22 0745  TempSrc:   PainSc: 0-No pain   Pain Goal:                   [facility] Records

## 2022-07-10 NOTE — Progress Notes (Signed)
Post Partum Day 1 Subjective: no complaints, up ad lib, voiding, tolerating PO, and + flatus  Objective: Blood pressure (!) 110/57, pulse 72, temperature 97.9 F (36.6 C), temperature source Oral, resp. rate 18, SpO2 100 %, unknown if currently breastfeeding.  Physical Exam:  General: alert, cooperative, and appears stated age CV rrr Resp CTA bl Lochia: appropriate Uterine Fundus: firm Incision: healing well, no significant drainage DVT Evaluation: No evidence of DVT seen on physical exam.     Latest Ref Rng & Units 07/10/2022    6:54 AM 07/09/2022    6:18 AM 07/09/2022    5:27 AM  CBC  WBC 4.0 - 10.5 K/uL 9.1  6.0  5.3   Hemoglobin 12.0 - 15.0 g/dL 96.2  95.2  84.1   Hematocrit 36.0 - 46.0 % 32.3  37.7  43.0   Platelets 150 - 400 K/uL 134  132  74      Assessment/Plan: PPD 1 SVD. L2G4010. Doing well Breast feeding, LC to assist Discharge if baby ready for dc too  PP care/ warning ss and PP depression ss dw pt.  F/up Dr Ernestina Penna 6 wks A1GDM this preg- check BS at home in 1 wks and 5 wks to bring log to PP visit    LOS: 1 day   Robley Fries, MD 07/10/2022, 10:30 AM

## 2022-07-10 NOTE — Discharge Summary (Signed)
Postpartum Discharge Summary    Patient Name: Cheryl Barron DOB: [birth year] MRN: [id number]  Date of admission: 07/09/2022 Delivery date:07/09/2022  Delivering provider: Dorisann Frames K  Date of discharge: 07/10/2022  Admitting diagnosis: Normal labor [O80, Z37.9] Intrauterine pregnancy: [redacted]w[redacted]d     Secondary diagnosis:  Principal Problem:   Postpartum care following vaginal delivery 6/10 Active Problems:   SVD (spontaneous vaginal delivery) 6/10   Normal labor   First degree perineal laceration   Gestational diabetes mellitus in pregnancy, diet controlled     Discharge diagnosis: Term Pregnancy Delivered                                              Post partum procedures: none Augmentation: AROM Complications: None  Hospital course: Onset of Labor With Vaginal Delivery      32 y.o. yo [id number] at [redacted]w[redacted]d was admitted in Active Labor on 07/09/2022. Labor course was complicated by none  Membrane Rupture Time/Date: 8:00 AM ,07/09/2022   Delivery Method:Vaginal, Spontaneous  Episiotomy: None  Lacerations:  1st degree  Patient had a postpartum course complicated by none.  She is ambulating, tolerating a regular diet, passing flatus, and urinating well. Patient is discharged home in stable condition on 07/10/22.  Newborn Data: Birth date:07/09/2022  Birth time:10:39 AM  Gender:Female  Living status:Living  Apgars:9 ,9  Weight:2970 g   Magnesium Sulfate received: NA BMZ received: NA Rhophylac:N/A MMR:N/A T-DaP:Given prenatally Flu: N/A Transfusion:No  Physical exam  Vitals:   07/09/22 1740 07/09/22 2151 07/10/22 0135 07/10/22 0522  BP: 118/73 121/68 90/60 (!) 110/57  Pulse: 68 71 74 72  Resp: 16 17 16 18   Temp: 98.2 F (36.8 C) 98.3 F (36.8 C) 97.9 F (36.6 C) 97.9 F (36.6 C)  TempSrc: Oral Oral Oral Oral  SpO2: 100% 100% 100% 100%   General: alert, cooperative, and no distress Lochia: appropriate Uterine Fundus: firm Incision: Healing well with no significant  drainage, No significant erythema DVT Evaluation: No evidence of DVT seen on physical exam. Labs: Lab Results  Component Value Date   WBC 9.1 07/10/2022   HGB 11.0 (L) 07/10/2022   HCT 32.3 (L) 07/10/2022   MCV 93.1 07/10/2022   PLT 134 (L) 07/10/2022       No data to display         Edinburgh Score:     No data to display            After visit meds:  Allergies as of 07/10/2022   No Known Allergies      Medication List     TAKE these medications    acetaminophen 325 MG tablet Commonly known as: Tylenol Take 2 tablets (650 mg total) by mouth every 4 (four) hours as needed (for pain scale < 4).   coconut oil Oil Apply 1 application topically as needed.   ibuprofen 600 MG tablet Commonly known as: ADVIL Take 1 tablet (600 mg total) by mouth every 6 (six) hours.   PRENATAL VITAMIN PO Take 1 capsule by mouth daily.         Discharge home in stable condition Infant Feeding: Bottle and Breast Infant Disposition:home with mother Discharge instruction: per After Visit Summary and Postpartum booklet. Activity: Advance as tolerated. Pelvic rest for 6 weeks.  Diet: routine diet Anticipated Birth Control: Unsure Postpartum Appointment:6 weeks Additional Postpartum F/U: sooner  as needed  Future Appointments:No future appointments. Follow up Visit: call Dr Elpidio Eric office to schedule     07/10/2022 Robley Fries, MD

## 2022-07-18 ENCOUNTER — Inpatient Hospital Stay (HOSPITAL_COMMUNITY): Admission: AD | Admit: 2022-07-18 | Payer: 59 | Source: Home / Self Care | Admitting: Obstetrics

## 2022-07-28 ENCOUNTER — Telehealth (HOSPITAL_COMMUNITY): Payer: Self-pay

## 2022-07-28 NOTE — Telephone Encounter (Signed)
07/28/2022 1535  Name: Cheryl Barron MRN: [id number] DOB: 1990-03-24  Reason for Call:  Transition of Care Hospital Discharge Call  Contact Status: Patient Contact Status: Complete  Language assistant needed: Interpreter Mode: Interpreter Not Needed        Follow-Up Questions: Do You Have Any Concerns About Your Health As You Heal From Delivery?: No Do You Have Any Concerns About Your Infants Health?: No  Edinburgh Postnatal Depression Scale:  In the Past 7 Days: I have been able to laugh and see the funny side of things.: As much as I always could I have looked forward with enjoyment to things.: As much as I ever did I have blamed myself unnecessarily when things went wrong.: No, never I have been anxious or worried for no good reason.: No, not at all I have felt scared or panicky for no good reason.: No, not at all Things have been getting on top of me.: No, I have been coping as well as ever I have been so unhappy that I have had difficulty sleeping.: Not at all I have felt sad or miserable.: No, not at all I have been so unhappy that I have been crying.: No, never The thought of harming myself has occurred to me.: Never Inocente Salles Postnatal Depression Scale Total: 0  PHQ2-9 Depression Scale:     Discharge Follow-up: Edinburgh score requires follow up?: No Patient was advised of the following resources:: Breastfeeding Support Group, Support Group Did patient express any COVID concerns?: No  Post-discharge interventions: NA  Signature Signe Colt
